# Patient Record
Sex: Male | Born: 1960 | Race: Black or African American | Hispanic: No | Marital: Married | Smoking: Never smoker
Health system: Southern US, Community
[De-identification: ages and names within clinical notes are randomized; demographics above are authoritative.]

## PROBLEM LIST (undated history)

## (undated) DIAGNOSIS — I10 Essential (primary) hypertension: Secondary | ICD-10-CM

## (undated) DIAGNOSIS — I8390 Asymptomatic varicose veins of unspecified lower extremity: Secondary | ICD-10-CM

## (undated) DIAGNOSIS — E079 Disorder of thyroid, unspecified: Secondary | ICD-10-CM

## (undated) HISTORY — PX: NECK SURGERY: SHX720

## (undated) HISTORY — PX: VARICOSE VEIN SURGERY: SHX832

---

## 2000-07-24 ENCOUNTER — Emergency Department (HOSPITAL_COMMUNITY): Admission: EM | Admit: 2000-07-24 | Discharge: 2000-07-24 | Payer: Self-pay | Admitting: Emergency Medicine

## 2004-07-16 ENCOUNTER — Emergency Department (HOSPITAL_COMMUNITY): Admission: EM | Admit: 2004-07-16 | Discharge: 2004-07-17 | Payer: Self-pay | Admitting: Emergency Medicine

## 2006-02-16 ENCOUNTER — Emergency Department (HOSPITAL_COMMUNITY): Admission: EM | Admit: 2006-02-16 | Discharge: 2006-02-16 | Payer: Self-pay | Admitting: Emergency Medicine

## 2006-05-31 ENCOUNTER — Other Ambulatory Visit: Admission: RE | Admit: 2006-05-31 | Discharge: 2006-05-31 | Payer: Self-pay | Admitting: Otolaryngology

## 2007-06-19 ENCOUNTER — Ambulatory Visit (HOSPITAL_BASED_OUTPATIENT_CLINIC_OR_DEPARTMENT_OTHER): Admission: RE | Admit: 2007-06-19 | Discharge: 2007-06-19 | Payer: Self-pay | Admitting: Internal Medicine

## 2007-06-22 ENCOUNTER — Ambulatory Visit: Payer: Self-pay | Admitting: Internal Medicine

## 2008-03-22 ENCOUNTER — Encounter: Admission: RE | Admit: 2008-03-22 | Discharge: 2008-03-22 | Payer: Self-pay | Admitting: Otolaryngology

## 2008-05-19 ENCOUNTER — Ambulatory Visit (HOSPITAL_COMMUNITY): Admission: RE | Admit: 2008-05-19 | Discharge: 2008-05-20 | Payer: Self-pay | Admitting: Otolaryngology

## 2008-05-19 ENCOUNTER — Encounter (INDEPENDENT_AMBULATORY_CARE_PROVIDER_SITE_OTHER): Payer: Self-pay | Admitting: Otolaryngology

## 2010-01-19 ENCOUNTER — Emergency Department (HOSPITAL_COMMUNITY): Admission: EM | Admit: 2010-01-19 | Discharge: 2010-01-19 | Payer: Self-pay | Admitting: Emergency Medicine

## 2010-01-19 ENCOUNTER — Encounter (INDEPENDENT_AMBULATORY_CARE_PROVIDER_SITE_OTHER): Payer: Self-pay | Admitting: Emergency Medicine

## 2010-01-19 ENCOUNTER — Ambulatory Visit: Payer: Self-pay | Admitting: Vascular Surgery

## 2011-02-14 LAB — POCT I-STAT, CHEM 8
BUN: 16 mg/dL (ref 6–23)
Chloride: 105 mEq/L (ref 96–112)
HCT: 47 % (ref 39.0–52.0)
Potassium: 3.7 mEq/L (ref 3.5–5.1)
Sodium: 139 mEq/L (ref 135–145)

## 2011-04-10 NOTE — Procedures (Signed)
Riley Coleman, Riley Coleman              ACCOUNT NO.:  [id number]   MEDICAL RECORD NO.:  [id number]          PATIENT TYPE:  OUT   LOCATION:  SLEEP CENTER                 FACILITY:  [facility]   PHYSICIAN:  Clinton D. Maple Hudson, MD, FCCP, FACPDATE OF BIRTH:  04-02-63   DATE OF STUDY:  06/19/2007                            NOCTURNAL POLYSOMNOGRAM   REFERRING PHYSICIAN:   REFERRING PHYSICIAN:  Gabriel Earing, M.D.   INDICATION FOR STUDY:  Hypersomnia with sleep apnea.   EPWORTH SLEEPINESS SCORE:  24/24.  BMI 40.5, weight 300 pounds.   MEDICATIONS:  Home medications listed and reviewed.   SLEEP ARCHITECTURE:  Total sleep time 379 minute with sleep efficiency  88%.  Stage I was 2%.  Stage II 55%.  Stage III 12%.  REM 30% of total  sleep time.  Sleep latency 9 minutes.  REM latency 113 minutes.  Awake  after sleep onset 41 minutes.  Arousal index 24.9 indicating increased  sleep fragmentation.  He slept only on his back.  No bedtime medication  was taken.   RESPIRATORY DATA:  Diagnostic NPSG protocol was ordered with  authorization to do split study in case of significant desaturation.  Split protocol was met.  Apnea hypopnea index (AHI, RDI) 94.2  obstructive events per hour indicating severe obstructive sleep  apnea/hypopnea syndrome before CPAP.  There were 242 obstructive apneas,  10 hypopneas before CPAP.  He slept only on his back and all events were  recorded in that position.  REM AHI 94.7.  CPAP was titrated to 25 CWP.  More breakthrough events were recorded at higher pressures suggesting he  was developing nasal congestion.  Best recorded pressure was at 22 chest  wall pain, AHI 3 per hour.  A large Comfort Full Gel Mask was chosen  with heated humidifier.   OXYGEN DATA:  Very loud snoring with oxygen desaturation to a nadir of  62% before CPAP.  Best oxygenation was associated with CPAP at 22 CWP  with continued scores between 88 and 92% on room air.   CARDIAC DATA:  Sinus rhythm  with PAC.   MOVEMENT-PARASOMNIA:  No significant body movement or limb disturbance.   IMPRESSIONS-RECOMMENDATIONS:  1. Severe obstructive sleep apnea/hypopnea syndrome, apnea hypopnea      index 94.2 per hour with all sleep and all events recorded while on      his back.  Very loud snoring with oxygen desaturation to a nadir of      62%.  2. CPAP was titrated to a recommended pressure of 22 CWP, apnea      hypopnea index 3 per hour.  A large Comfort Full Gel Mask was used      with heated humidifier.  Oxygen desaturation still seemed to be      holding between 88 and 92% at this setting.  Consider establishing      home CPAP or BiPAP at an initial inspiratory      pressure of 22 CWP.  Then consider overnight oxymetry while he      wears CPAP to determine whether he needs supplemental home oxygen      during sleep.  Clinton D. Maple Hudson, MD, [facility], FACP  Diplomate, Biomedical engineer of Sleep Medicine  Electronically Signed     CDY/MEDQ  D:  06/22/2007 12:09:14  T:  06/23/2007 09:02:08  Job:  [id number]

## 2011-04-10 NOTE — Op Note (Signed)
NAMEBEAUREGARD, JARRELLS              ACCOUNT NO.:  [id number]   MEDICAL RECORD NO.:  [id number]          PATIENT TYPE:  OIB   LOCATION:  2621                         FACILITY:  [facility]   PHYSICIAN:  Kinnie Scales. Annalee Genta, M.D.DATE OF BIRTH:  [birth year]   DATE OF PROCEDURE:  DATE OF DISCHARGE:                               OPERATIVE REPORT   LOCATION:  [facility] Main OR.   PREOPERATIVE DIAGNOSES:  1. Massive right thyroid tumor.  2. History of obstructive sleep apnea.   POSTOPERATIVE DIAGNOSES:  1. Massive right thyroid tumor.  2. History of obstructive sleep apnea.   INDICATIONS FOR SURGERY:  1. Massive right thyroid tumor.  2. History of obstructive sleep apnea.   SURGICAL PROCEDURES:  Right thyroid lobectomy with mediastinal  exploration.   ANESTHESIA:  General endotracheal with NIMS endotracheal tube.   SURGEON:  Kinnie Scales. Annalee Genta, MD   ASSISTANT:  Antony Contras, MD   SPECIMENS:  Sent to pathology.   ESTIMATED BLOOD LOSS:  Less than 100 mL.   No complications.   The patient was transferred from the operating room to recovery in  stable condition.   BRIEF HISTORY:  Mr. Pinheiro is a 50 year old black male who was referred  for evaluation by his primary care physician for evaluation of an  enlarging right thyroid mass.  The patient was initially seen over 1  year ago.  Fine-needle aspiration of the mass was performed, and  pathology was suspicious for possible thyroid neoplasm.  At that time, I  recommended thyroid lobectomy.  The patient deferred surgery and  presented over 1 year later with an increase in size of the mass.  Prior  to surgery, an MRI scan was obtained to better delineate the anatomy.  The patient was found to have a 6 x 8 cm right thyroid lobe with  significant tracheal deviation and extension to the mediastinum and  right upper chest.  Given the patient's history, physical examination,  and findings, I recommended to undertake a right  hemithyroidectomy with  expiration of the mediastinum and possible total thyroidectomy based on  frozen section analysis.  The risk, benefits, and possible complications  of this procedure were discussed in detail.  The patient understood and  concurred with our plan for surgery.  He also had a significant history  of severe obstructive sleep apnea, and we anticipated postoperative  monitoring in the Step-Down Unit.  The patient understood and concurred  with our plan which was scheduled at [facility] Main OR on May 19, 2008.   PROCEDURE:  The patient was brought to the operating room at [facility] Main OR, placed in supine position on the operating table.  General endotracheal anesthesia was established.  A Xomed NIMS  endotracheal tube was used Systems analyst System) for  intraoperative monitoring of the recurrent laryngeal nerves.  The  patient was then position on the operating table and prepped and draped  in a sterile fashion.  He was injected with 4 mL of 1% lidocaine with  1:100,000 solution of epinephrine in a subcutaneous fashion to  the  proposed skin incision.  The patient's surgery then begun by creating a  curvilinear low anterior neck incision measuring approximately 8 cm in  length.  This was carried through the skin and underlying deep  subcutaneous tissue laterally.  The platysma muscle was identified and  divided, and subplatysmal flaps were elevated in order to allow access  to the anterior compartment of the neck.  A self-retaining retractor was  placed.  The midline neck was gently palpated.  There was significant  tracheal and laryngeal deviation because of the size of the right  thyroid mass with rotation and deviation to the left.  The strap muscles  were identified and divided in the midline.  They were then lateralized  allowing access to the anterior compartment.  The right thyroid lobe was  palpated, very large in nature and  somewhat nodular.  The procedure  begun by dissecting and then dividing the thyroid isthmus to allow  observation of the anterior trachea.  This was accomplished using  clamps, and a 2-0 chromic suture to ligate the left aspect of thyroid  isthmus.  The left thyroid lobe was then gently palpated, and there was  no evidence of nodularity, mass, or tumor and it appeared to be normal  in size and configuration.  Dissection was then carried out elevating  the strap muscles and sternocleidomastoid muscle laterally in order to  allow access to the right aspect of the neck.  Dissection was carried  out along the superior aspect of the thyroid lobe dissecting along the  right perilaryngeal aspect of the neck.  The superior pole of the  thyroid was then gently dissected with traction to bring the thyroid  vessels into view.  These were divided and suture ligated with 2-0 silk  suture.  The thyroid lobe was then elevated anteriorly and dissection  was carried out along the lateral and posterior aspect of the gland  dividing the middle thyroid vein and small contributory blood vessels.  The recurrent laryngeal nerve was then identified in the deep aspect of  the dissection.  This was confirmed with the nerve monitoring system,  and the right recurrent laryngeal nerve was then dissected from proximal  to distal to its insertion on the laryngeal musculature.  With the nerve  identified and preserved, the remainder of the thyroid lobe was elevated  off the trachea, and using both blunt and sharp dissection it was  removed from its tracheal attachments.  The inferior aspect of the gland  was then palpated in the deep aspect of the superior mediastinum and  neck.  Overlying fascia was then gently elevated.  The gland was  retracted superiorly.  The inferior thyroid vasculature was then gently  dissected and divided and suture ligated.  Contributory vessels from the  thyroid vein and mediastinum were then  gently dissected, divided, and  suture ligated.  The apex of the lung was visualized in the right  superior mediastinum.  The pleura was intact and there was no evidence  of a pneumothorax or air leak.  The gland was then fully mobilized and  resected from its deep attachments.  There was no significant bleeding.  The gland was then sent to pathology for frozen section analysis.  Pathology showed a follicular neoplasm.  Malignant tumor could not be  confirmed, and the procedure was then terminated with a right  hemithyroidectomy rather than completing the left side.  The patient's  dissection was then thoroughly irrigated with saline.  There were small  areas of point hemorrhages that were cauterized with bipolar cautery.  The patient's wound was then closed in multiple layers.  Prior to  closure, a 7-mm Blake drain was placed at the depth of the neck incision  and carried out through a separate stab incision in the anterior neck  skin.  Strap muscles were reapproximated with a 3-0 Vicryl suture.  Deep  muscular layer was closed 3-0 Vicryl as was as the platysma layer.  Subcutaneous closure with a 4-0 Vicryl suture in an interrupted fashion  was then pleated and the final skin closure with Dermabond  surgical glue was then applied.  The patient was then awakened from his  anesthetic.  He was extubated and transferred from the operating room to  the recovery in stable condition.  No complications.  Estimated blood  loss was less 100 mL.           ______________________________  Kinnie Scales. Annalee Genta, M.D.     DLS/MEDQ  D:  45/40/9811  T:  05/20/2008  Job:  [id number]

## 2011-08-23 LAB — CBC
Hemoglobin: 16.1
MCHC: 33.8
MCV: 86.1
RBC: 5.52
WBC: 5.1

## 2011-08-23 LAB — BASIC METABOLIC PANEL
CO2: 28
Chloride: 103
Creatinine, Ser: 1.22
GFR calc Af Amer: >60
Potassium: 4.2
Sodium: 137

## 2012-04-15 ENCOUNTER — Emergency Department (HOSPITAL_COMMUNITY): Payer: BC Managed Care – PPO

## 2012-04-15 ENCOUNTER — Observation Stay (HOSPITAL_COMMUNITY)
Admission: EM | Admit: 2012-04-15 | Discharge: 2012-04-17 | Disposition: A | Discharge status: Home / Self Care | Payer: BC Managed Care – PPO | Attending: Internal Medicine | Admitting: Internal Medicine

## 2012-04-15 ENCOUNTER — Encounter (HOSPITAL_COMMUNITY): Payer: Self-pay

## 2012-04-15 DIAGNOSIS — Z79899 Other long term (current) drug therapy: Secondary | ICD-10-CM | POA: Insufficient documentation

## 2012-04-15 DIAGNOSIS — G4733 Obstructive sleep apnea (adult) (pediatric): Secondary | ICD-10-CM | POA: Insufficient documentation

## 2012-04-15 DIAGNOSIS — I959 Hypotension, unspecified: Secondary | ICD-10-CM | POA: Diagnosis present

## 2012-04-15 DIAGNOSIS — R55 Syncope and collapse: Principal | ICD-10-CM

## 2012-04-15 DIAGNOSIS — M6282 Rhabdomyolysis: Secondary | ICD-10-CM | POA: Insufficient documentation

## 2012-04-15 DIAGNOSIS — G4730 Sleep apnea, unspecified: Secondary | ICD-10-CM

## 2012-04-15 DIAGNOSIS — N179 Acute kidney failure, unspecified: Secondary | ICD-10-CM

## 2012-04-15 DIAGNOSIS — I10 Essential (primary) hypertension: Secondary | ICD-10-CM

## 2012-04-15 HISTORY — DX: Essential (primary) hypertension: I10

## 2012-04-15 LAB — CBC
HCT: 52.9 % — ABNORMAL HIGH (ref 39.0–52.0)
MCH: 25.2 pg — ABNORMAL LOW (ref 26.0–34.0)
MCV: 78.5 fL (ref 78.0–100.0)
Platelets: 162 10*3/uL (ref 150–400)
RBC: 6.74 MIL/uL — ABNORMAL HIGH (ref 4.22–5.81)
RDW: 16.7 % — ABNORMAL HIGH (ref 11.5–15.5)
WBC: 8.3 10*3/uL (ref 4.0–10.5)

## 2012-04-15 LAB — BASIC METABOLIC PANEL
CO2: 28 mEq/L (ref 19–32)
Calcium: 9.3 mg/dL (ref 8.4–10.5)
Creatinine, Ser: 2.22 mg/dL — ABNORMAL HIGH (ref 0.50–1.35)
GFR calc non Af Amer: 33 mL/min — ABNORMAL LOW (ref >90)
Glucose, Bld: 72 mg/dL (ref 70–99)

## 2012-04-15 LAB — DIFFERENTIAL
Eosinophils Absolute: 0.2 10*3/uL (ref 0.0–0.7)
Eosinophils Relative: 2 % (ref 0–5)
Lymphocytes Relative: 19 % (ref 12–46)
Lymphs Abs: 1.6 10*3/uL (ref 0.7–4.0)
Monocytes Absolute: 0.9 10*3/uL (ref 0.1–1.0)

## 2012-04-15 MED ORDER — SODIUM CHLORIDE 0.9 % IV BOLUS (SEPSIS)
500.0000 mL | Freq: Once | INTRAVENOUS | Status: AC
  Administered 2012-04-15: 500 mL via INTRAVENOUS

## 2012-04-15 MED ORDER — SODIUM CHLORIDE 0.9 % IV SOLN
INTRAVENOUS | Status: DC
  Administered 2012-04-15 – 2012-04-16 (×3): via INTRAVENOUS

## 2012-04-15 NOTE — ED Notes (Signed)
Pt had two sycopal episodes today, [place] [address], no injury from this episode, he saw a cardiologist yesterday and was given a good report, he states today he was coughing on the highway and then woke up to a lady knocking on his window.

## 2012-04-15 NOTE — ED Provider Notes (Signed)
History     CSN: [id number]  Arrival date & time 04/15/12  7829   First MD Initiated Contact with Patient 04/15/12 2153      Chief Complaint  Patient presents with  . Loss of Consciousness    (Consider location/radiation/quality/duration/timing/severity/associated sxs/prior treatment) Patient is a 51 y.o. male presenting with syncope. The history is provided by the patient.  Loss of Consciousness This is a recurrent problem. Pertinent negatives include no chest pain, no abdominal pain, no headaches and no shortness of breath.   patient had a syncopal episode today. He states that he was in the car and began to cough. He states he felt like his to pass out and then later woke up in the middle the highway to someone knocking on his window. No chest pain. He's had previous episodes of syncope. One was when he was straining to go to the bathroom. He states he saw Dr. Jacinto Halim, his cardiologist, yesterday he changed his metoprolol dose. He states that he has an enlarged heart. No headaches. No confusion. No trouble breathing. No family history of early death of unknown cause.  Past Medical History  Diagnosis Date  . Hypertension     History reviewed. No pertinent past surgical history.  History reviewed. No pertinent family history.  History  Substance Use Topics  . Smoking status: Not on file  . Smokeless tobacco: Not on file  . Alcohol Use: No      Review of Systems  Constitutional: Negative for activity change and appetite change.  HENT: Negative for neck stiffness.   Eyes: Negative for pain.  Respiratory: Negative for chest tightness and shortness of breath.   Cardiovascular: Positive for syncope. Negative for chest pain and leg swelling.  Gastrointestinal: Negative for nausea, vomiting, abdominal pain and diarrhea.  Genitourinary: Negative for flank pain.  Musculoskeletal: Negative for back pain.  Skin: Negative for rash.  Neurological: Positive for dizziness and syncope.  Negative for weakness, numbness and headaches.  Psychiatric/Behavioral: Negative for behavioral problems.    Allergies  Review of patient's allergies indicates no known allergies.  Home Medications   Current Outpatient Rx  Name Route Sig Dispense Refill  . METOPROLOL TARTRATE 100 MG PO TABS Oral Take 50 mg by mouth 2 (two) times daily.    Marland Kitchen OLMESARTAN-AMLODIPINE-HCTZ 40-10-25 MG PO TABS Oral Take 1 tablet by mouth every morning.    Marland Kitchen VITAMIN D (ERGOCALCIFEROL) 50000 UNITS PO CAPS Oral Take 50,000 Units by mouth See admin instructions. Take on sundays      BP 101/55  Pulse 83  Temp(Src) 98 F (36.7 C) (Oral)  Resp 20  Ht 6' (1.829 m)  Wt 280 lb (127.007 kg)  BMI 37.97 kg/m2  SpO2 93%  Physical Exam  Nursing note and vitals reviewed. Constitutional: He is oriented to person, place, and time. He appears well-developed and well-nourished.  HENT:  Head: Normocephalic and atraumatic.  Eyes: Pupils are equal, round, and reactive to light.  Neck: Normal range of motion. Neck supple.  Cardiovascular: Normal rate, regular rhythm and normal heart sounds.   No murmur heard. Pulmonary/Chest: Effort normal and breath sounds normal.  Abdominal: Soft. Bowel sounds are normal. He exhibits no distension and no mass. There is no tenderness. There is no rebound and no guarding.  Musculoskeletal: Normal range of motion. He exhibits no edema.  Neurological: He is alert and oriented to person, place, and time. No cranial nerve deficit.  Skin: Skin is warm and dry.  Psychiatric: He has a  normal mood and affect.    ED Course  Procedures (including critical care time)  Labs Reviewed  CBC - Abnormal; Notable for the following:    RBC 6.74 (*)    HCT 52.9 (*)    MCH 25.2 (*)    RDW 16.7 (*)    All other components within normal limits  BASIC METABOLIC PANEL - Abnormal; Notable for the following:    BUN 24 (*)    Creatinine, Ser 2.22 (*)    GFR calc non Af Amer 33 (*)    GFR calc Af Amer  39 (*)    All other components within normal limits  D-DIMER, QUANTITATIVE - Abnormal; Notable for the following:    D-Dimer, Quant 0.74 (*)    All other components within normal limits  DIFFERENTIAL  TROPONIN I   Dg Chest 2 View  04/15/2012  *RADIOLOGY REPORT*  Clinical Data: Syncope, fever, cough.  CHEST - 2 VIEW  Comparison: 05/18/2008  Findings: Cardiomegaly.  Central vascular congestion.  Interstitial prominence.  No focal consolidation, pleural effusion, or pneumothorax.  No definite nodule of small nodules could be obscured by the interstitial prominence.  No acute osseous finding.  IMPRESSION: Cardiomegaly with central vascular congestion.  Mild nonspecific interstitial prominence without focal consolidation.  Original Report Authenticated By: Waneta Martins, M.D.     1. Syncope      Date: 04/15/2012  Rate: 82  Rhythm: normal sinus rhythm  QRS Axis: normal  Intervals: normal  ST/T Wave abnormalities: nonspecific ST/T changes  Conduction Disutrbances:none  Narrative Interpretation: Q wave inversion inferiorly. Q wave in lead III, S wave in lead I  Old EKG Reviewed: changes noted    MDM  Patient was syncope. He was with coughing, but while he was driving. No chest pain. He does have a pulse ox of 73%. D-dimer was done and is positive. He also has renal insufficiency. His blood pressures improved with some fluids. His been told in the past he has a large heart. He'll be admitted to medicine. His EKG has changed as compared to the old from 2011.       Juliet Rude. Rubin Payor, MD 04/16/12 [phone]

## 2012-04-16 ENCOUNTER — Observation Stay (HOSPITAL_COMMUNITY): Payer: BC Managed Care – PPO

## 2012-04-16 ENCOUNTER — Encounter (HOSPITAL_COMMUNITY): Payer: Self-pay | Admitting: Family Medicine

## 2012-04-16 DIAGNOSIS — G4710 Hypersomnia, unspecified: Secondary | ICD-10-CM

## 2012-04-16 DIAGNOSIS — I10 Essential (primary) hypertension: Secondary | ICD-10-CM

## 2012-04-16 DIAGNOSIS — N179 Acute kidney failure, unspecified: Secondary | ICD-10-CM

## 2012-04-16 DIAGNOSIS — G4730 Sleep apnea, unspecified: Secondary | ICD-10-CM

## 2012-04-16 DIAGNOSIS — R55 Syncope and collapse: Secondary | ICD-10-CM

## 2012-04-16 DIAGNOSIS — I959 Hypotension, unspecified: Secondary | ICD-10-CM | POA: Diagnosis present

## 2012-04-16 LAB — CBC
MCH: 24.9 pg — ABNORMAL LOW (ref 26.0–34.0)
MCHC: 32.1 g/dL (ref 30.0–36.0)
MCV: 77.8 fL — ABNORMAL LOW (ref 78.0–100.0)
Platelets: 156 10*3/uL (ref 150–400)
RDW: 16.7 % — ABNORMAL HIGH (ref 11.5–15.5)

## 2012-04-16 LAB — COMPREHENSIVE METABOLIC PANEL
ALT: 29 U/L (ref 0–53)
AST: 29 U/L (ref 0–37)
Albumin: 3.6 g/dL (ref 3.5–5.2)
Alkaline Phosphatase: 51 U/L (ref 39–117)
CO2: 29 mEq/L (ref 19–32)
Chloride: 98 mEq/L (ref 96–112)
Potassium: 3.8 mEq/L (ref 3.5–5.1)
Total Bilirubin: 0.9 mg/dL (ref 0.3–1.2)

## 2012-04-16 LAB — CARDIAC PANEL(CRET KIN+CKTOT+MB+TROPI)
Relative Index: 1 (ref 0.0–2.5)
Relative Index: 1.4 (ref 0.0–2.5)
Total CK: 851 U/L — ABNORMAL HIGH (ref 7–232)
Total CK: 678 U/L — ABNORMAL HIGH (ref 7–232)
Troponin I: <0.3 ng/mL (ref <0.30)
Troponin I: <0.3 ng/mL (ref <0.30)
Troponin I: <0.3 ng/mL (ref <0.30)

## 2012-04-16 MED ORDER — VITAMIN D (ERGOCALCIFEROL) 1.25 MG (50000 UNIT) PO CAPS: 50000.0000 [IU] | ORAL_CAPSULE | ORAL | Status: DC

## 2012-04-16 MED ORDER — HEPARIN SODIUM (PORCINE) 5000 UNIT/ML IJ SOLN
5000.0000 [IU] | Freq: Three times a day (TID) | INTRAMUSCULAR | Status: DC
  Administered 2012-04-16 – 2012-04-17 (×4): 5000 [IU] via SUBCUTANEOUS
  Filled 2012-04-16 (×7): qty 1

## 2012-04-16 MED ORDER — TECHNETIUM TO 99M ALBUMIN AGGREGATED
5.0000 | Freq: Once | INTRAVENOUS | Status: AC | PRN
  Administered 2012-04-16: 5 via INTRAVENOUS

## 2012-04-16 MED ORDER — ENOXAPARIN SODIUM 150 MG/ML ~~LOC~~ SOLN
1.0000 mg/kg | Freq: Once | SUBCUTANEOUS | Status: AC
  Administered 2012-04-16: 130 mg via SUBCUTANEOUS
  Filled 2012-04-16: qty 1

## 2012-04-16 MED ORDER — METOPROLOL TARTRATE 25 MG PO TABS
25.0000 mg | ORAL_TABLET | Freq: Two times a day (BID) | ORAL | Status: DC
  Administered 2012-04-16 – 2012-04-17 (×3): 25 mg via ORAL
  Filled 2012-04-16 (×5): qty 1

## 2012-04-16 MED ORDER — SODIUM CHLORIDE 0.9 % IJ SOLN
3.0000 mL | Freq: Two times a day (BID) | INTRAMUSCULAR | Status: DC
  Administered 2012-04-16: 3 mL via INTRAVENOUS

## 2012-04-16 MED ORDER — ASPIRIN EC 325 MG PO TBEC
325.0000 mg | DELAYED_RELEASE_TABLET | Freq: Every day | ORAL | Status: DC
  Administered 2012-04-16 – 2012-04-17 (×2): 325 mg via ORAL
  Filled 2012-04-16 (×2): qty 1

## 2012-04-16 MED ORDER — AMLODIPINE BESYLATE 2.5 MG PO TABS
2.5000 mg | ORAL_TABLET | Freq: Every day | ORAL | Status: DC
  Administered 2012-04-16 – 2012-04-17 (×2): 2.5 mg via ORAL
  Filled 2012-04-16 (×2): qty 1

## 2012-04-16 NOTE — Progress Notes (Signed)
Pt's CK MB elevated at 8.3 and CK Total 851. 1st and 2nd set of Troponins negative. NP on call, K. Schorr notified.  No new orders at this time.  Will continue to monitor pt.

## 2012-04-16 NOTE — Progress Notes (Signed)
Subjective: Feels fine today.  Objective: Weight change:   Intake/Output Summary (Last 24 hours) at 04/16/12 1514 Last data filed at 04/16/12 1500  Gross per 24 hour  Intake 1410.42 ml  Output   1075 ml  Net 335.42 ml    Filed Vitals:   04/16/12 1020  BP: 127/78  Pulse:   Temp:   Resp:   General: Patient does not seem to be in any acute distress.  Cardiovascular: Regular rate rhythm Lungs: Clear to auscultation bilaterally Abdomen: Positive bowel sounds Extremities: Edema mostly in the right lower extremity that is chronic  Lab Results: reviewed  Studies/Results: Dg Chest 2 View  04/15/2012  *RADIOLOGY REPORT*  Clinical Data: Syncope, fever, cough.  CHEST - 2 VIEW  Comparison: 05/18/2008  Findings: Cardiomegaly.  Central vascular congestion.  Interstitial prominence.  No focal consolidation, pleural effusion, or pneumothorax.  No definite nodule of small nodules could be obscured by the interstitial prominence.  No acute osseous finding.  IMPRESSION: Cardiomegaly with central vascular congestion.  Mild nonspecific interstitial prominence without focal consolidation.  Original Report Authenticated By: Waneta Martins, M.D.   Ct Head Wo Contrast  04/16/2012  *RADIOLOGY REPORT*  Clinical Data: Near syncope.  Hypertension  CT HEAD WITHOUT CONTRAST  Technique:  Contiguous axial images were obtained from the base of the skull through the vertex without contrast.  Comparison: None.  Findings: Ventricle size is normal.  Negative for infarct, mass, or hemorrhage.  No edema or shift of the midline structures. Calvarium is intact.  IMPRESSION: Normal examination.  Original Report Authenticated By: Camelia Phenes, M.D.   Nm Pulmonary Perfusion  04/16/2012  *RADIOLOGY REPORT*  Clinical Data: Elevated D-dimer.  Syncope.  NM PULMONARY PERFUSION PARTICULATE  Radiopharmaceutical: CURIE MAA TECHNETIUM TO 68M ALBUMIN AGGREGATED  Comparison: Chest x-ray from 04/15/2012.  Findings:  Ventilation imaging was not performed secondary to lack of Xe 133 availability.  Multiplanar perfusion imaging shows no segmental perfusion defect in either lung.  IMPRESSION: Normal bilateral lung perfusion.  Original Report Authenticated By: ERIC A. MANSELL, M.D.   Medications: Scheduled Meds:   . amLODipine  2.5 mg Oral Daily  . aspirin EC  325 mg Oral Daily  . enoxaparin (LOVENOX) injection  1 mg/kg Subcutaneous Once  . heparin  5,000 Units Subcutaneous Q8H  . metoprolol tartrate  25 mg Oral BID  . sodium chloride  500 mL Intravenous Once  . sodium chloride  3 mL Intravenous Q12H  . Vitamin D (Ergocalciferol)  50,000 Units Oral Q Sun   Continuous Infusions:   . sodium chloride 50 mL/hr at 04/16/12 1200   PRN Meds:.technetium albumin aggregated  Assessment/Plan: Syncope (04/16/2012) Discussed with Dr. Jacinto Halim. This is most likely secondary to hypotension since he was just recently started on Metoprolol and when he came in his blood pressure was in the 90s systolic. Metoprolol dose has been  decreased and his Lasix has been discontinued. He will followup outpatient tomorrow with Dr. Verl Dicker office at 4:30 PM for an event monitor that he will wear for a month. He will also follow up with Dr. Jacinto Halim for office visit on Thursday June 6 at 3:30 PM. Will also order an EEG, carotid Dopplers, head CT and V/Q scan.   Sleep apnea (04/16/2012) Outpatient followup  HTN (hypertension) (04/16/2012) Blood pressure mildly elevated. Decrease IV fluids.   AKI (acute kidney injury) (04/16/2012) Patient's creatinine is elevated from baseline. Will continue to monitor.   Hypotension (04/16/2012)  as mentioned above hypertension is secondary  to medication and medications have been adjusted.    LOS: 1 day   Cristan Scherzer JARRETT 04/16/2012, 3:14 PM Pager [phone]

## 2012-04-16 NOTE — Progress Notes (Signed)
Pt's BP elevated when standing BP taken during Orthostatic Vitals (163/109).  BP elevated again when a.m. Vitals taken at [phone].  BP  155/108.  NP on call notified, K. Black.  Will continue to monitor pt.

## 2012-04-16 NOTE — Progress Notes (Signed)
   CARE MANAGEMENT NOTE 04/16/2012  Patient:  Riley Coleman, Riley Coleman   Account Number:  [id number]  Date Initiated:  04/16/2012  Documentation initiated by:  Jiles Crocker  Subjective/Objective Assessment:   ADMITTED WITH SYNCOPAL EPISODE     Action/Plan:   INDEPENDENT PRIOR TO ADMISSION; LIVES WITH SPOUSE   Anticipated DC Date:  04/17/2012   Anticipated DC Plan:  HOME/SELF CARE           Status of service:  In process, will continue to follow Medicare Important Message given?  NA - LOS <3 / Initial given by admissions (If response is "NO", the following Medicare IM given date fields will be blank)  Per UR Regulation:  Reviewed for med. necessity/level of care/duration of stay  Comments:  04/16/2012- B Ezrael Sam RN, BSN, MHA

## 2012-04-16 NOTE — Progress Notes (Addendum)
Pt having obvious sleep apnea during day shift.  Very sleepy, sleeping almost all the time but wakes easily.  Snoring loudly with periods of 3-10 seconds of apnea between snores/breaths.  When awakened pt stated that he does suffer from sleep apnea and that he is supposed to wear a cpap while sleeping.  Text-paged Dr. Earlene Plater to notify, requested order for cpap.  Pt's vital signs stable, will continue to monitor while awaiting orders.  Ardyth Gal, RN 04/16/2012  Order received for cpap.  Ardyth Gal, RN 04/16/2012

## 2012-04-16 NOTE — Progress Notes (Signed)
VASCULAR LAB PRELIMINARY  PRELIMINARY  PRELIMINARY  PRELIMINARY  Carotid duplex completed.    Preliminary report:  Bilateral:  No evidence of hemodynamically significant internal carotid artery stenosis.   Vertebral artery flow is antegrade.     NATHAN, MICHELLE, RDMS, RDCS 04/16/2012, 1:21 PM  Quashaun Lazalde D, RVS 04/16/2012, 1:21 PM

## 2012-04-16 NOTE — H&P (Signed)
History and Physical  Riley Coleman ZOX:[id number] DOB: 1961/05/01 DOA: 04/15/2012  Referring physician: PCP: No primary provider on file.   Chief Complaint: syncope  HPI:  51 yr old male was driving home from work today and staretd to cough uncontrollably while he was driving and was getting ready pass out while [place] [address] and he started to have some shaking of his hands an dfelt like he was about to pass out--Felt like a lightheadedness coming on and "felt the blood rushing to his head" NO h/o seizures, no strokwe in the past-- came [place] to a woman knocking on his winow in the middle of [place].  This ha shappeend before-has happened some times before-Before with a  Burst varcisoe vein, another time he had tried to carry his wife when she had hurt her ACL.  Another rtime this happened when he wa son the commode strainign and passe out onto the bathroom floor.   Saw Dr. Jacinto Halim which was done at Dr. Payton Spark office-stress test done was okay per Wife's report NO cp, n/v/sob/blurred vision+ occasional, no double vision, no cough cold or fever, no diarrhoea, no dysuria Has been passing less urine than usual and is "dark"  States that his medications have changed recently to metoprolol bid  Wasn't confused when he came to   Uses CPPA usually-once in a while only and is non-compliant.  Has had  Asleep study in the past which was + for desats 911 tiems  Chart Review:  Review of Systems:  Neg except as per above  Past Medical History  Diagnosis Date  . Hypertension     History reviewed. No pertinent past surgical history.  Social History:  does not have a smoking history on file. He does not have any smokeless tobacco history on file. He reports that he does not drink alcohol. His drug history not on file.  No Known Allergies  History reviewed. No pertinent family history.   Prior to Admission medications   Medication Sig Start Date End Date Taking?  Authorizing Provider  metoprolol (LOPRESSOR) 100 MG tablet Take 50 mg by mouth 2 (two) times daily.   Yes Historical Provider, MD  Olmesartan-Amlodipine-HCTZ (TRIBENZOR) 40-10-25 MG TABS Take 1 tablet by mouth every morning.   Yes Historical Provider, MD  Vitamin D, Ergocalciferol, (DRISDOL) 50000 UNITS CAPS Take 50,000 Units by mouth See admin instructions. Take on sundays   Yes Historical Provider, MD   Physical Exam: Filed Vitals:   04/15/12 1949 04/15/12 2229 04/16/12 0025  BP: 91/62 101/55   Pulse: 92 83   Temp: 97.2 F (36.2 C) 98 F (36.7 C)   TempSrc: Oral Oral   Resp: 20 20   Height:   6' (1.829 m)  Weight:   127.007 kg (280 lb)  SpO2: 93% 93%      General:  Morbidly obese African American male, no apparent distress  Eyes: Pallor no icterus  ENT: Mallampati 4  Neck: Soft supple no JVD  Cardiovascular: S1-S2 no murmur rub or gallop  Respiratory: Clinically clear  Abdomen: Soft nontender nondistended  Skin:  edema slight on right side  Musculoskeletal: Good range of motion bilaterally  Psychiatric: Pleasant  Neurologic: Cranial nerves II through XII grossly intact  Labs on Admission:  Basic Metabolic Panel:  Lab 04/15/12 8119  NA 136  K 3.6  CL 99  CO2 28  GLUCOSE 72  BUN 24*  CREATININE 2.22*  CALCIUM 9.3  MG --  PHOS --    Liver Function Tests: No results found for this basename: AST:5,ALT:5,ALKPHOS:5,BILITOT:5,PROT:5,ALBUMIN:5 in the last 168 hours No results found for this basename: LIPASE:5,AMYLASE:5 in the last 168 hours No results found for this basename: AMMONIA:5 in the last 168 hours  CBC:  Lab 04/15/12 2206  WBC 8.3  NEUTROABS 5.6  HGB 17.0  HCT 52.9*  MCV 78.5  PLT 162    Cardiac Enzymes:  Lab 04/15/12 2206  CKTOTAL --  CKMB --  CKMBINDEX --  TROPONINI <0.30    Troponin (Point of Care Test) No results found for this basename: TROPIPOC in the last 72 hours  BNP (last 3 results) No results found for this  basename: PROBNP:3 in the last 8760 hours  CBG: No results found for this basename: GLUCAP:5 in the last 168 hours   Radiological Exams on Admission: Dg Chest 2 View  04/15/2012  *RADIOLOGY REPORT*  Clinical Data: Syncope, fever, cough.  CHEST - 2 VIEW  Comparison: 05/18/2008  Findings: Cardiomegaly.  Central vascular congestion.  Interstitial prominence.  No focal consolidation, pleural effusion, or pneumothorax.  No definite nodule of small nodules could be obscured by the interstitial prominence.  No acute osseous finding.  IMPRESSION: Cardiomegaly with central vascular congestion.  Mild nonspecific interstitial prominence without focal consolidation.  Original Report Authenticated By: Waneta Martins, M.D.    EKG: Independently reviewed. Sinus rhythm rate 80 PR interval 0.12 QRS axis 50, cell T-wave elevations in V2 V3 V4 however it is a nonspecific. Some T wave inversions in lead 3. And flattening of aVF-no EKG for comparison   Active Problems:  * No active hospital problems. *     Assessment/Plan 1. Syncope related to uncontrollable cough-patient has probably strong vasovagal reflexes and this is also possibly related to orthostasis from multiple blood pressure medications. We'll get orthostatic blood pressures, hold antihypertensives other than low-dose amlodipine and reassess 2. AK I.-patient baseline creatinine 1.2 this is double this almost. We'll replete with IV fluids when (per hour and review.  His d-dimer is elevated and VQ scan has been ordered by emergency room physician. Given the fact he has no chest pain or pressure shortness breath feel this would be low yield and not necessarily test for this. If patient has further chest pain would proceed with test 3. Obstructive sleep apnea-patient needs to establish care with pulmonologist 4. Recent evaluation by cardiologist-patient's stress test recently that was totally negative. Please contact Dr. Jacinto Halim with these results in  the morning if needed  Code Status: Full Family Communication: Wife in room Disposition Plan: Melynda Ripple on [facility] long team 4, Dr.Davis  Pleas Koch, MD  [facility] Pager [phone]  If 8PM-8AM, please contact floor/night-coverage at [email or web address], password [facility] 04/16/2012, 12:27 AM

## 2012-04-16 NOTE — Progress Notes (Signed)
Assumed care of this pt at this time.  Pt sleeping but awakens easily, denies needs or concerns at this time.  Reviewed and agree with previous shift assessment.  Will continue to monitor pt.  Ardyth Gal, RN 04/16/2012

## 2012-04-17 ENCOUNTER — Observation Stay (HOSPITAL_COMMUNITY)
Admit: 2012-04-17 | Discharge: 2012-04-17 | Disposition: A | Discharge status: Home / Self Care | Payer: BC Managed Care – PPO | Attending: Internal Medicine | Admitting: Internal Medicine

## 2012-04-17 DIAGNOSIS — G4730 Sleep apnea, unspecified: Secondary | ICD-10-CM

## 2012-04-17 DIAGNOSIS — G4710 Hypersomnia, unspecified: Secondary | ICD-10-CM

## 2012-04-17 DIAGNOSIS — N179 Acute kidney failure, unspecified: Secondary | ICD-10-CM

## 2012-04-17 DIAGNOSIS — R55 Syncope and collapse: Secondary | ICD-10-CM

## 2012-04-17 DIAGNOSIS — I10 Essential (primary) hypertension: Secondary | ICD-10-CM

## 2012-04-17 LAB — BASIC METABOLIC PANEL
CO2: 29 mEq/L (ref 19–32)
Calcium: 9 mg/dL (ref 8.4–10.5)
Chloride: 99 mEq/L (ref 96–112)
GFR calc Af Amer: 62 mL/min — ABNORMAL LOW (ref >90)
Sodium: 135 mEq/L (ref 135–145)

## 2012-04-17 LAB — GLUCOSE, CAPILLARY: Glucose-Capillary: 70 mg/dL (ref 70–99)

## 2012-04-17 LAB — CBC
MCH: 24.4 pg — ABNORMAL LOW (ref 26.0–34.0)
Platelets: 153 10*3/uL (ref 150–400)
RBC: 6.96 MIL/uL — ABNORMAL HIGH (ref 4.22–5.81)
WBC: 5.4 10*3/uL (ref 4.0–10.5)

## 2012-04-17 MED ORDER — AMLODIPINE BESYLATE 2.5 MG PO TABS: 2.5000 mg | ORAL_TABLET | Freq: Every day | ORAL | Status: DC

## 2012-04-17 MED ORDER — METOPROLOL TARTRATE 25 MG PO TABS: 25.0000 mg | ORAL_TABLET | Freq: Two times a day (BID) | ORAL | Status: DC

## 2012-04-17 MED ORDER — ASPIRIN 81 MG PO TBEC: 81.0000 mg | DELAYED_RELEASE_TABLET | Freq: Every day | ORAL | Status: AC

## 2012-04-17 NOTE — Progress Notes (Signed)
Portable EEG completed at [facility]. 

## 2012-04-17 NOTE — Procedures (Signed)
EEG NUMBER:  REFERRING PHYSICIAN:  Dr. Earlene Plater.  HISTORY:  A 51 year old male with syncope evaluated to rule out seizures.  MEDICATIONS:  Norvasc, aspirin, Lopressor.  CONDITIONS OF RECORDING:  This is a 16 channel EEG carried out with the patient in the awake, drowsy, and asleep states.  DESCRIPTION:  The waking background activity consists of a low-voltage, symmetrical, fairly well-organized 8-9 Hz alpha activity seen from the parieto-occipital and posterotemporal regions.  Low-voltage, fast activity, poorly organized was seen anteriorly at times, superimposed on more posterior rhythms.  A mixture of theta and alpha was seen from the central and temporal regions.  The patient drowses with slowing to irregular which is theta and beta activity.  The patient goes into a light sleep with symmetrical sleep spindles, vertex with sharp activity and irregular slow activity.  Hyperventilation was not performed. Intermittent photic stimulation failed to elicit any change in the tracing.  IMPRESSION:  This is a normal EEG.          ______________________________ Thana Farr, MD    GE:XBMW D:  04/17/2012 18:48:11  T:  04/17/2012 21:30:37  Job #:  [id number]

## 2012-04-17 NOTE — Discharge Summary (Signed)
DISCHARGE SUMMARY  EON ZUNKER  MR#: [id number]  DOB:Jan 17, 1961  Date of Admission: 04/15/2012 Date of Discharge: 04/17/2012  Attending Physician:Iban Utz JARRETT  Patient's PCP:Dr Greggory Stallion Osiu Bonseu Consults:Dr [facility] phone consult  Discharge Diagnoses: Syncope  .Hypotension Acute renal failure Acute kidney injury Obstructive sleep apnea Mild rhabdomyolysis  Initial presentation: 51 yr old male was driving home from work today and staretd to cough uncontrollably while he was driving and was getting ready to pass out [place] [address].  He started to shake and felt like he was about to pass out, "I felt the blood rushing to my head"  NO h/o seizures or stroke.  [place], a woman was knocking on his window in the [place].  He had a syncopal episode in the past when varcisoe vein ruptured, another time he had tried to carry his wife when she had hurt her ACL. Another time this happened when he was on the commode strainign and passed out onto the bathroom floor.  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 He has had  NO cp,no nausea or vomiting.  States that his medications have changed recently to metoprolol bid  He was not confused when he came to  He has a history of sleep apnea for which he uses a CPAP at home.   Hospital Course: Syncope/hypotension/hypertension Patient was admitted to the hospital secondary to a syncopal episode. He was noted to be hypotensive. He most likely passed out secondary to hypotension from medication. His metoprolol will be reduced at the time of discharge. This was discussed with Dr. Jacinto Halim. He will follow up with Dr. Verl Dicker office to get a Holter monitor.Marland Kitchen He will followup today. He had a head CT scan done that was negative. A d-dimer was elevated followed by a VQ scan that was negative. Carotid Dopplers was negative. He had a stress test outpatient  recently so a 2-D echo was not done.  Acute kidney injury Patient's creatinine was elevated at the time of admission. Creatinine has trended back down to 1.49 at the time of discharge. Most likely reason for elevated creatinine is probably secondary to the hypotension.  Obstructive sleep apnea Patient wear's a CPAP at home he was continued on CPAP inpatient.  Mild rhabdomyolysis Patient's creatinine is slightly elevated. He stated that he had worked out with weight lifting. This is most likely the etiology of the elevated creatinine kinase and it is improving.   Medication List  As of 04/17/2012  9:46 AM   STOP taking these medications         TRIBENZOR 40-10-25 MG Tabs         TAKE these medications         amLODipine 2.5 MG tablet   Commonly known as: NORVASC   Take 1 tablet (2.5 mg total) by mouth daily.      aspirin 81 MG EC tablet   Take 1 tablet (81 mg total) by mouth daily. Swallow whole.      metoprolol tartrate 25 MG tablet   Commonly known as: LOPRESSOR   Take 1 tablet (25 mg total) by mouth 2 (two) times daily.      Vitamin D (Ergocalciferol) 50000 UNITS Caps   Commonly known as: DRISDOL   Take 50,000 Units by mouth See admin instructions. Take on sundays             Day  of Discharge BP 129/83  Pulse 79  Temp(Src) 98.3 F (36.8 C) (Oral)  Resp 20  Ht 6' (1.829 m)  Wt 136.9 kg (301 lb 13 oz)  BMI 40.93 kg/m2  SpO2 92%  Physical Exam:   Results for orders placed during the hospital encounter of 04/15/12 (from the past 24 hour(s))  CARDIAC PANEL(CRET KIN+CKTOT+MB+TROPI)     Status: Abnormal   Collection Time   04/16/12  9:55 AM      Component Value Range   Total CK 678 (*) 7 - 232 (U/L)   CK, MB 8.3 (*) 0.3 - 4.0 (ng/mL)   Troponin I <0.30  <0.30 (ng/mL)   Relative Index 1.2  0.0 - 2.5   CARDIAC PANEL(CRET KIN+CKTOT+MB+TROPI)     Status: Abnormal   Collection Time   04/16/12  6:07 PM      Component Value Range   Total CK 546 (*) 7 - 232 (U/L)     CK, MB 7.5 (*) 0.3 - 4.0 (ng/mL)   Troponin I <0.30  <0.30 (ng/mL)   Relative Index 1.4  0.0 - 2.5   CBC     Status: Abnormal   Collection Time   04/17/12  5:10 AM      Component Value Range   WBC 5.4  4.0 - 10.5 (K/uL)   RBC 6.96 (*) 4.22 - 5.81 (MIL/uL)   Hemoglobin 17.0  13.0 - 17.0 (g/dL)   HCT 08.6 (*) 57.8 - 52.0 (%)   MCV 78.6  78.0 - 100.0 (fL)   MCH 24.4 (*) 26.0 - 34.0 (pg)   MCHC 31.1  30.0 - 36.0 (g/dL)   RDW 46.9 (*) 62.9 - 15.5 (%)   Platelets 153  150 - 400 (K/uL)  BASIC METABOLIC PANEL     Status: Abnormal   Collection Time   04/17/12  5:10 AM      Component Value Range   Sodium 135  135 - 145 (mEq/L)   Potassium 4.2  3.5 - 5.1 (mEq/L)   Chloride 99  96 - 112 (mEq/L)   CO2 29  19 - 32 (mEq/L)   Glucose, Bld 83  70 - 99 (mg/dL)   BUN 14  6 - 23 (mg/dL)   Creatinine, Ser 5.28 (*) 0.50 - 1.35 (mg/dL)   Calcium 9.0  8.4 - 41.3 (mg/dL)   GFR calc non Af Amer 54 (*) >90 (mL/min)   GFR calc Af Amer 62 (*) >90 (mL/min)  GLUCOSE, CAPILLARY     Status: Normal   Collection Time   04/17/12  7:52 AM      Component Value Range   Glucose-Capillary 70  70 - 99 (mg/dL)    Disposition: Home   Follow-up Appts: Discharge Orders    Future Appointments: Provider: Department: Dept Phone: Center:   04/17/2012 12:00 PM [facility]  None     Future Orders Please Complete By Expires   Diet - low sodium heart healthy         Follow-up Information    Follow up with OSEI-BONSU,GEORGE, MD in 1 week.   Contact information:   [address] [place] [address] [phone]       Follow up with Pamella Pert, MD. (Today at 4:30PM)    Contact information:   [address]  [place] [address] [phone]       Follow up with Pamella Pert, MD on 05/01/2012. (at 3:30 PM)    Contact information:  [address]  [place] [address] [phone]          Tests Needing  Follow-up: Kidney function and creatinine kinase  Time spent in discharge (includes decision making & examination of pt): 60  minutes  Signed: Silver Parkey JARRETT 04/17/2012, 9:46 AM Pager [phone]

## 2012-04-22 ENCOUNTER — Encounter: Payer: Self-pay | Admitting: Internal Medicine

## 2013-02-26 ENCOUNTER — Emergency Department (HOSPITAL_COMMUNITY)
Admission: EM | Admit: 2013-02-26 | Discharge: 2013-02-26 | Discharge status: Absent without leave | Payer: Self-pay | Source: Home / Self Care

## 2014-05-09 IMAGING — CR DG CHEST 2V
2 series · 2 of 2 positions shown · non-contrast
Comparison: 05/18/2008

CLINICAL DATA: Syncope, fever, cough.

CHEST - 2 VIEW

[w chest pa]
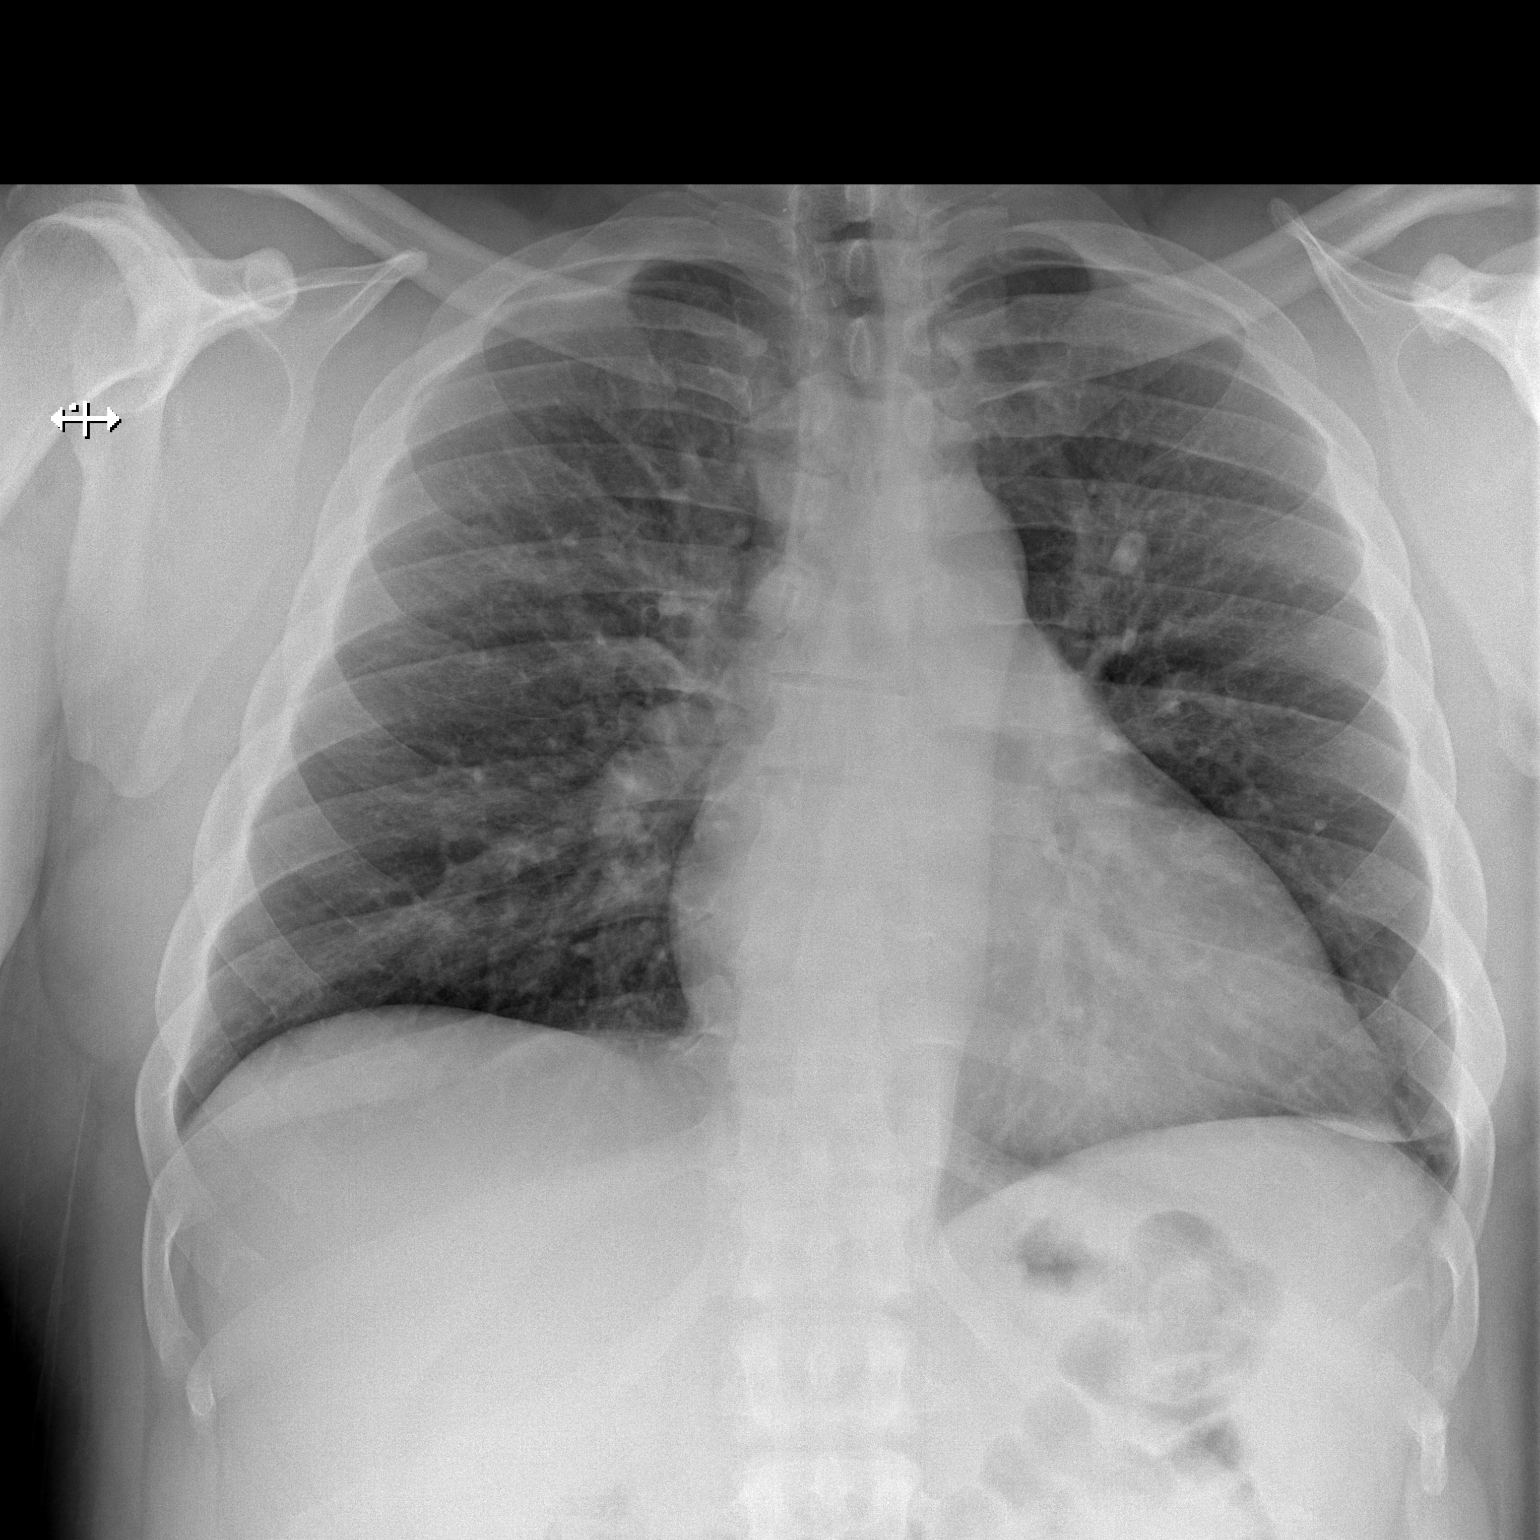

[w chest lat]
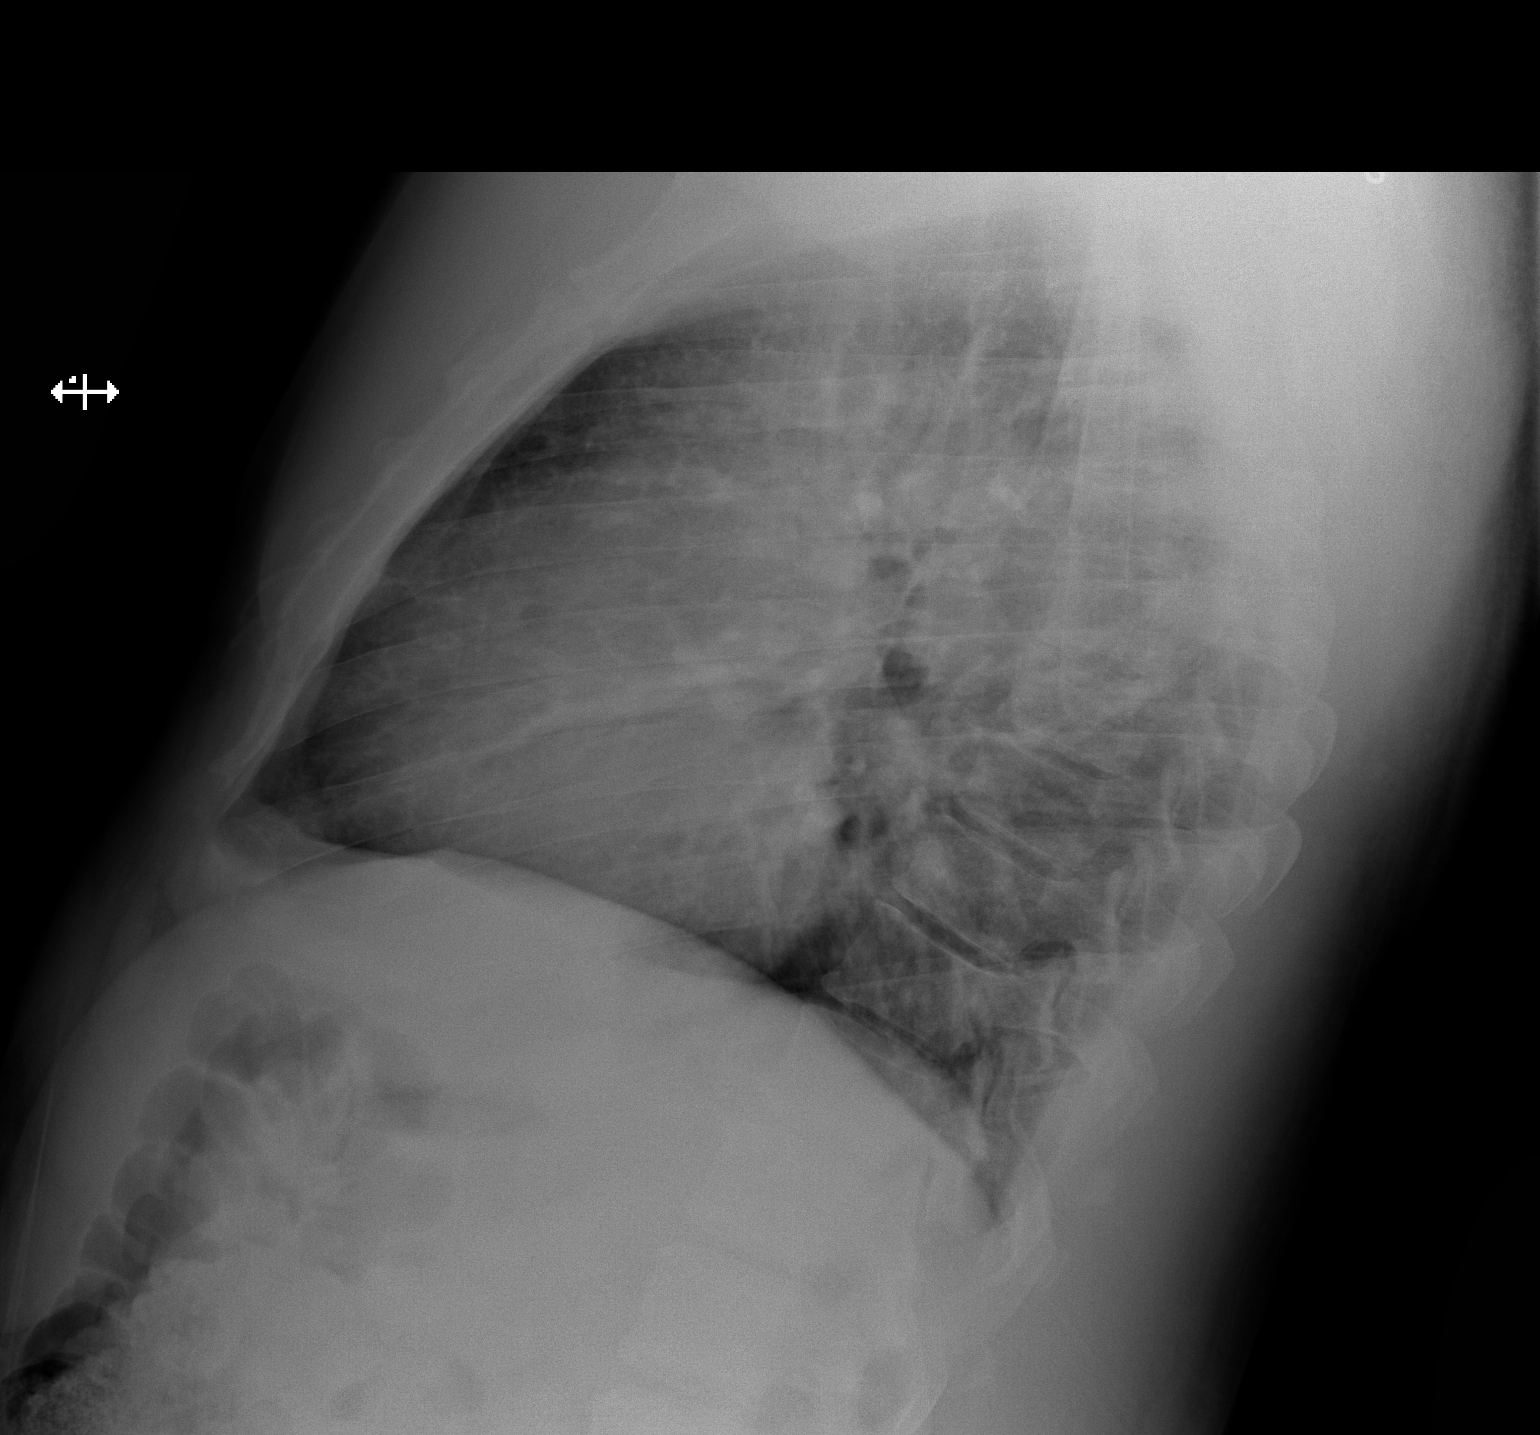

[2 of 2 positions shown; findings below may reference images not displayed]

FINDINGS: Cardiomegaly.  Central vascular congestion.  Interstitial
prominence.  No focal consolidation, pleural effusion, or
pneumothorax.  No definite nodule of small nodules could be
obscured by the interstitial prominence.  No acute osseous finding.
IMPRESSION: Cardiomegaly with central vascular congestion.

Mild nonspecific interstitial prominence without focal
consolidation.

## 2014-05-10 IMAGING — CT CT HEAD W/O CM
1 series · 16 of 30 positions shown, 20 images · non-contrast
Comparison: None.

CLINICAL DATA: Near syncope.  Hypertension

CT HEAD WITHOUT CONTRAST
TECHNIQUE: Contiguous axial images were obtained from the base of
the skull through the vertex without contrast.

[Series 2: head_seq 4.5 h37s st · axial · 0.43mm/px · z∈[-118,+30]mm · 16 of 36 slices shown, 20 images]
[im 2/36  brain]
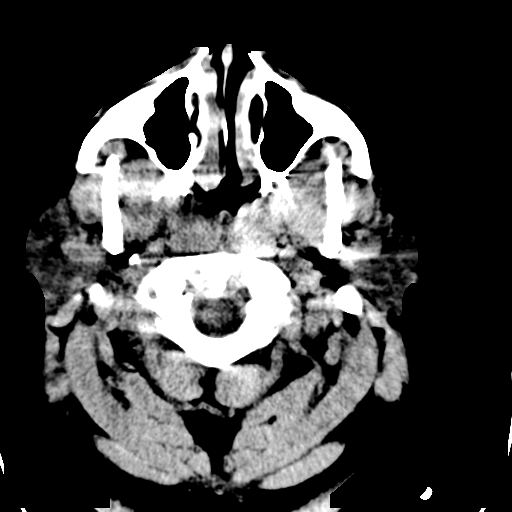
[im 2/36  bone]
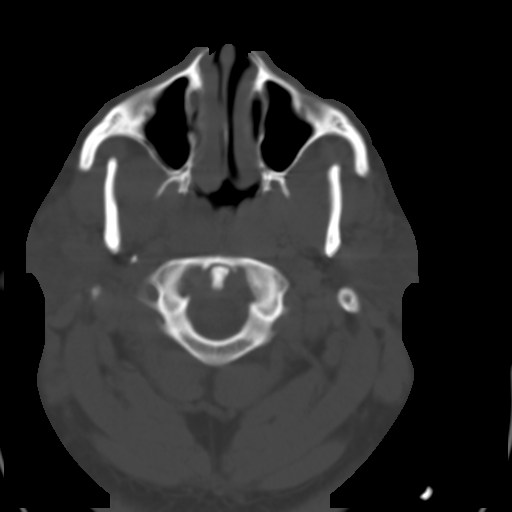
[im 4/36  brain]
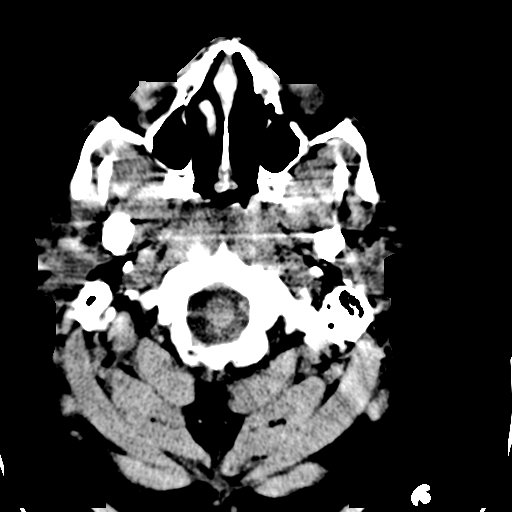
[im 7/36  brain]
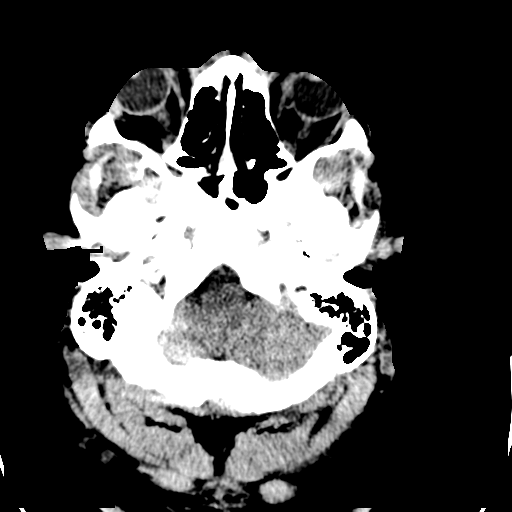
[im 9/36  brain]
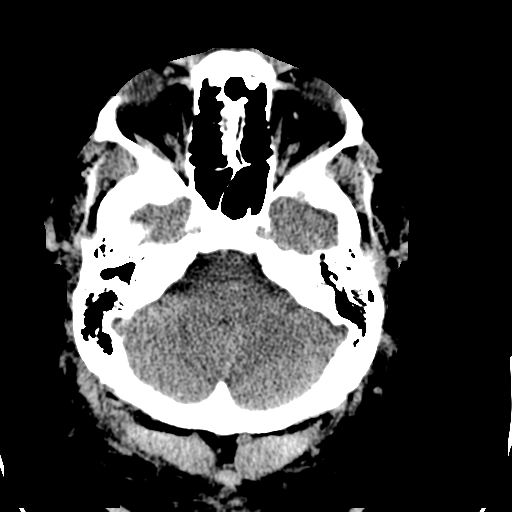
[im 10/36  brain]
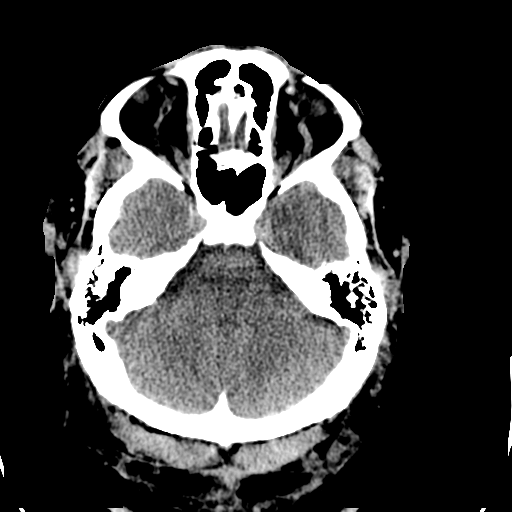
[im 10/36  bone]
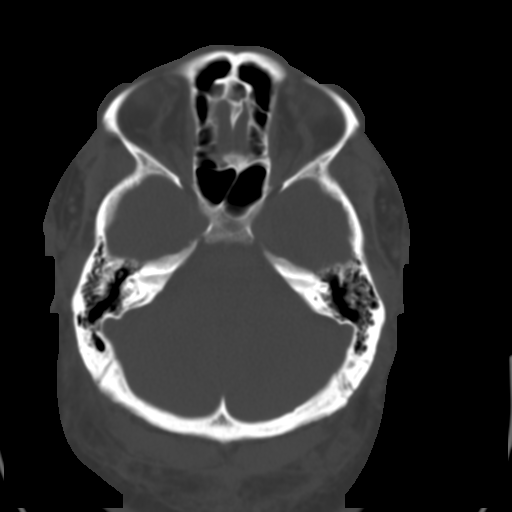
[im 13/36  brain]
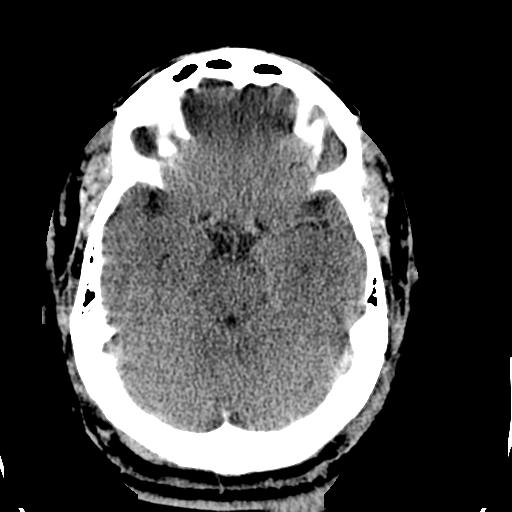
[im 15/36  brain]
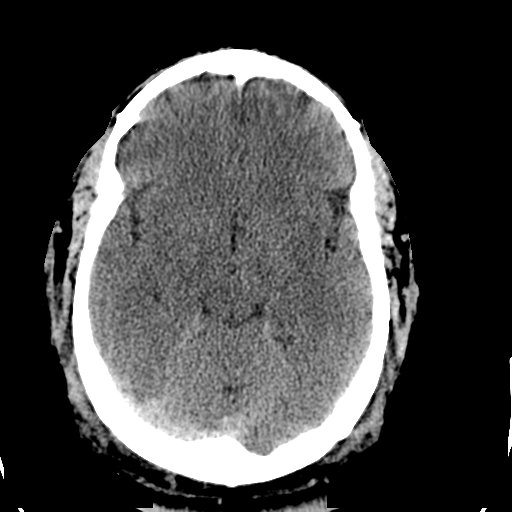
[im 17/36  brain]
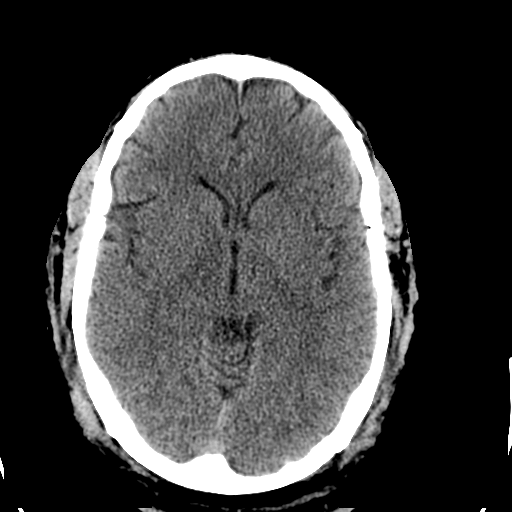
[im 19/36  brain]
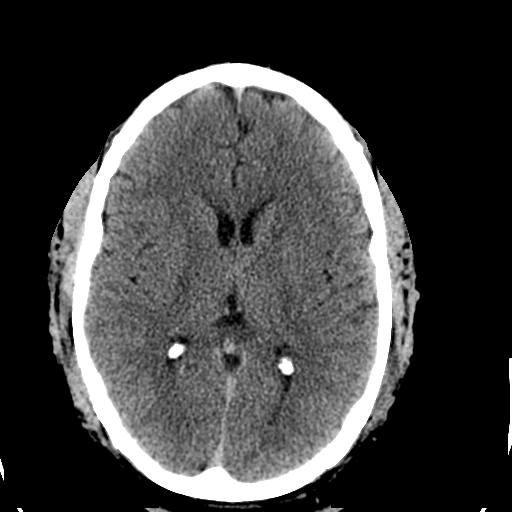
[im 19/36  bone]
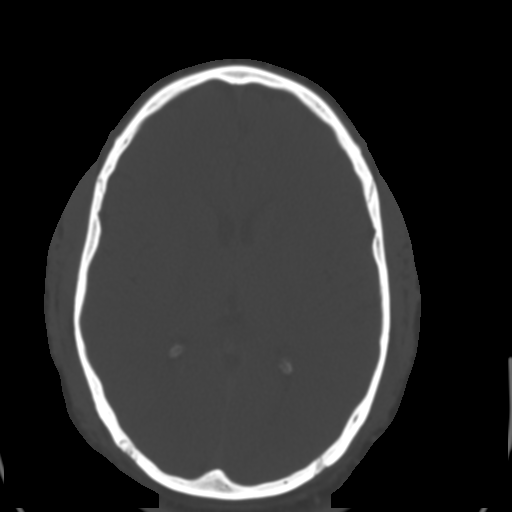
[im 21/36  brain]
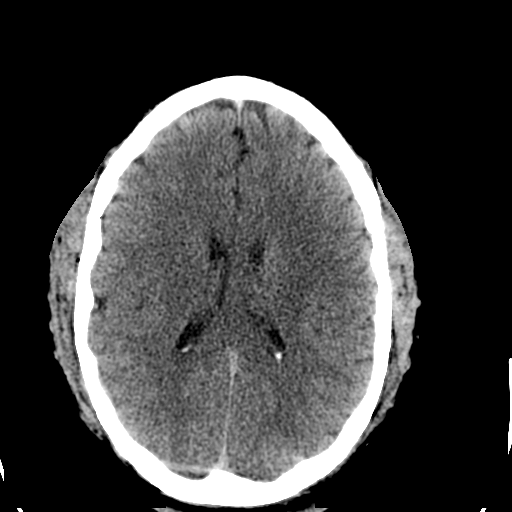
[im 23/36  brain]
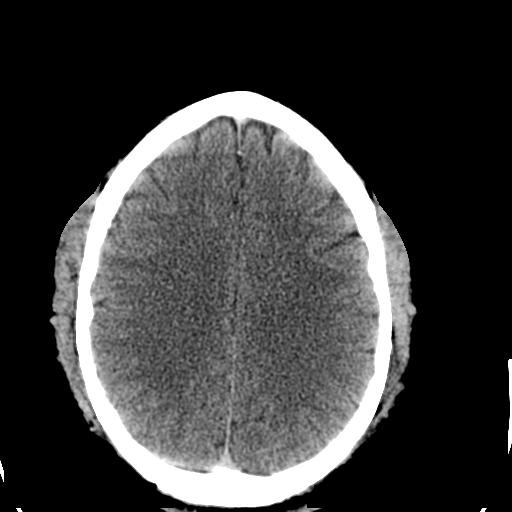
[im 26/36  brain]
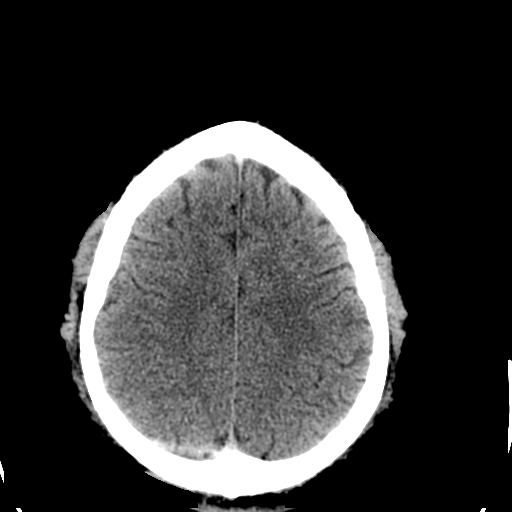
[im 27/36  brain]
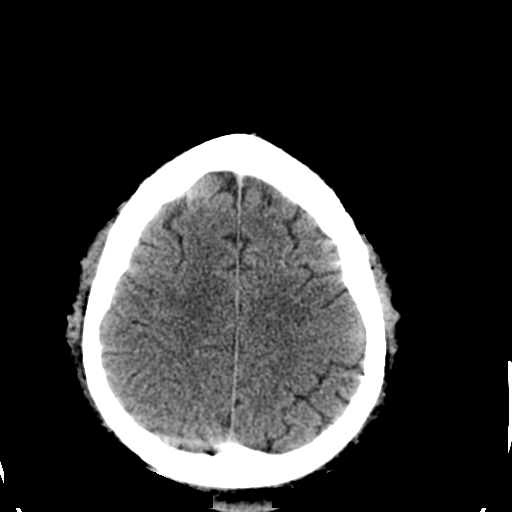
[im 27/36  bone]
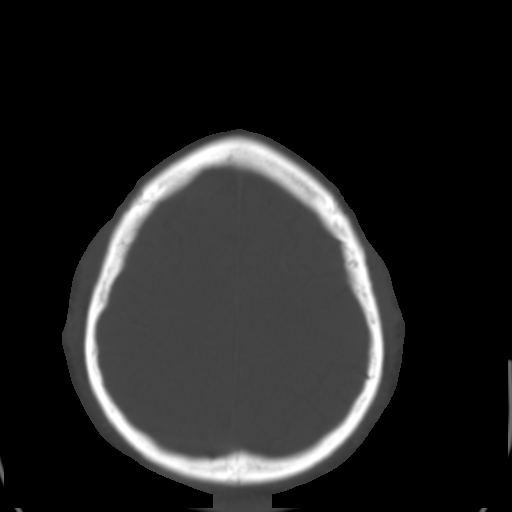
[im 29/36  brain]
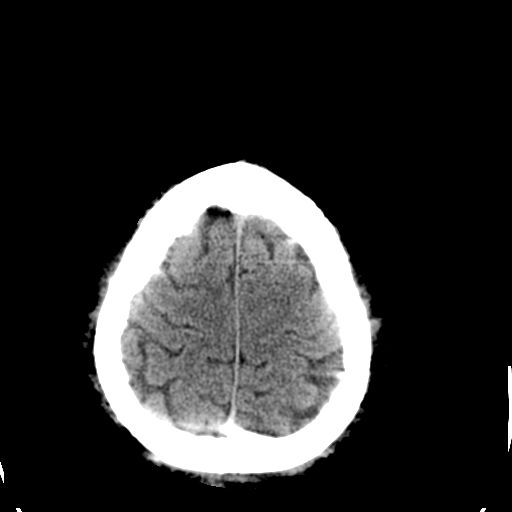
[im 32/36  brain]
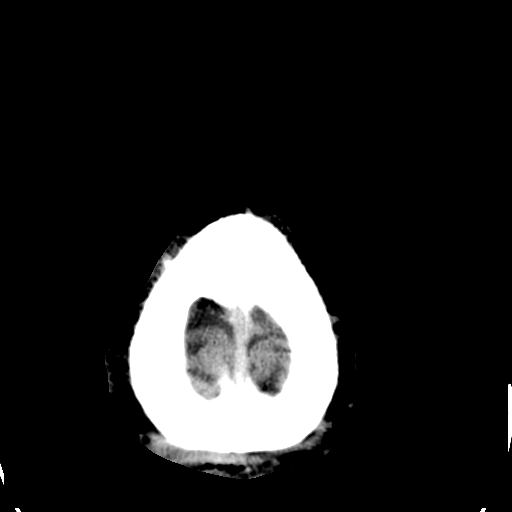
[im 34/36  brain]
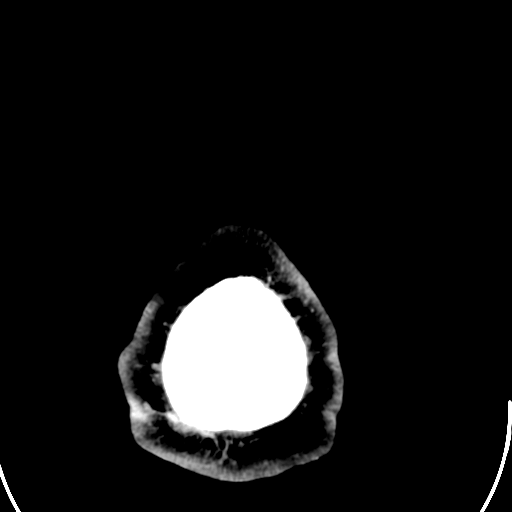

[16 of 30 positions shown; findings below may reference images not displayed]

FINDINGS: Ventricle size is normal.  Negative for infarct, mass, or
hemorrhage.  No edema or shift of the midline structures.
Calvarium is intact.
IMPRESSION: Normal examination.

## 2015-11-09 ENCOUNTER — Encounter (HOSPITAL_COMMUNITY): Payer: Self-pay | Admitting: *Deleted

## 2015-11-09 ENCOUNTER — Emergency Department (HOSPITAL_COMMUNITY)
Admission: EM | Admit: 2015-11-09 | Discharge: 2015-11-09 | Disposition: A | Discharge status: Home / Self Care | Payer: BLUE CROSS/BLUE SHIELD | Attending: Emergency Medicine | Admitting: Emergency Medicine

## 2015-11-09 DIAGNOSIS — I8393 Asymptomatic varicose veins of bilateral lower extremities: Secondary | ICD-10-CM | POA: Diagnosis present

## 2015-11-09 DIAGNOSIS — Z79899 Other long term (current) drug therapy: Secondary | ICD-10-CM | POA: Insufficient documentation

## 2015-11-09 DIAGNOSIS — Z8639 Personal history of other endocrine, nutritional and metabolic disease: Secondary | ICD-10-CM | POA: Insufficient documentation

## 2015-11-09 DIAGNOSIS — Z7982 Long term (current) use of aspirin: Secondary | ICD-10-CM | POA: Diagnosis not present

## 2015-11-09 DIAGNOSIS — I10 Essential (primary) hypertension: Secondary | ICD-10-CM | POA: Diagnosis not present

## 2015-11-09 DIAGNOSIS — I8390 Asymptomatic varicose veins of unspecified lower extremity: Secondary | ICD-10-CM

## 2015-11-09 DIAGNOSIS — Z9889 Other specified postprocedural states: Secondary | ICD-10-CM | POA: Diagnosis not present

## 2015-11-09 DIAGNOSIS — I878 Other specified disorders of veins: Secondary | ICD-10-CM

## 2015-11-09 HISTORY — DX: Disorder of thyroid, unspecified: E07.9

## 2015-11-09 HISTORY — DX: Asymptomatic varicose veins of unspecified lower extremity: I83.90

## 2015-11-09 NOTE — ED Notes (Signed)
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

## 2015-11-09 NOTE — ED Notes (Signed)
Pt stood no bleeding noted RN made aware

## 2015-11-09 NOTE — ED Notes (Signed)
Pt states "I stand up @ work all day long, I wear compression stockings & when I got home today my shoe was filled with blood.  I've had veins removed and been dealing with this 2-3 yrs."  Pt presents with leg wrapped in towel & 3 tourniquettes.  Tournequettes & towel removed, no bleeding noted, abd applied to site & wrapped with Kerlex.

## 2015-11-09 NOTE — Discharge Instructions (Signed)
Varicose Veins  Varicose veins are veins that have become enlarged and twisted. They are usually seen in the legs but can occur in other parts of the body as well.  CAUSES  This condition is the result of valves in the veins not working properly. Valves in the veins help to return blood from the leg to the heart. If these valves are damaged, blood flows backward and backs up into the veins in the leg near the skin. This causes the veins to become larger.  RISK FACTORS  People who are on their feet a lot, who are pregnant, or who are overweight are more likely to develop varicose veins.  SIGNS AND SYMPTOMS  · Bulging, twisted-appearing, bluish veins, most commonly found on the legs.  · Leg pain or a feeling of heaviness. These symptoms may be worse at the end of the day.  · Leg swelling.  · Changes in skin color.  DIAGNOSIS  A health care provider can usually diagnose varicose veins by examining your legs. Your health care provider may also recommend an ultrasound of your leg veins.  TREATMENT  Most varicose veins can be treated at home. However, other treatments are available for people who have persistent symptoms or want to improve the cosmetic appearance of the varicose veins. These treatment options include:  · Sclerotherapy. A solution is injected into the vein to close it off.  · Laser treatment. A laser is used to heat the vein to close it off.  · Radiofrequency vein ablation. An electrical current produced by radio waves is used to close off the vein.  · Phlebectomy. The vein is surgically removed through small incisions made over the varicose vein.  · Vein ligation and stripping. The vein is surgically removed through incisions made over the varicose vein after the vein has been tied (ligated).  HOME CARE INSTRUCTIONS  · Do not stand or sit in one position for long periods of time. Do not sit with your legs crossed. Rest with your legs raised during the day.  · Wear compression stockings as directed by your  health care provider. These stockings help to prevent blood clots and reduce swelling in your legs.  · Do not wear other tight, encircling garments around your legs, pelvis, or waist.  · Walk as much as possible to increase blood flow.  · Raise the foot of your bed at night with 2-inch blocks.  · If you get a cut in the skin over the vein and the vein bleeds, lie down with your leg raised and press on it with a clean cloth until the bleeding stops. Then place a bandage (dressing) on the cut. See your health care provider if it continues to bleed.  SEEK MEDICAL CARE IF:  · The skin around your ankle starts to break down.  · You have pain, redness, tenderness, or hard swelling in your leg over a vein.  · You are uncomfortable because of leg pain.     This information is not intended to replace advice given to you by your health care provider. Make sure you discuss any questions you have with your health care provider.     Document Released: 08/22/2005 Document Revised: 12/03/2014 Document Reviewed: 03/30/2014  Elsevier Interactive Patient Education ©2016 [employer]    Venous Stasis or Chronic Venous Insufficiency  Chronic venous insufficiency, also called venous stasis, is a condition that affects the veins in the legs. The condition prevents blood from being pumped through these veins effectively.   Blood may no longer be pumped effectively from the legs back to the heart. This condition can range from mild to severe. With proper treatment, you should be able to continue with an active life.  CAUSES   Chronic venous insufficiency occurs when the vein walls become stretched, weakened, or damaged or when valves within the vein are damaged. Some common causes of this include:  · High blood pressure inside the veins (venous hypertension).  · Increased blood pressure in the leg veins from long periods of sitting or standing.  · A blood clot that blocks blood flow in a vein (deep vein thrombosis).  · Inflammation of a  superficial vein (phlebitis) that causes a blood clot to form.  RISK FACTORS  Various things can make you more likely to develop chronic venous insufficiency, including:  · Family history of this condition.  · Obesity.  · Pregnancy.  · Sedentary lifestyle.  · Smoking.  · Jobs requiring long periods of standing or sitting in one place.  · Being a certain age. Women in their 40s and 50s and men in their 70s are more likely to develop this condition.  SIGNS AND SYMPTOMS   Symptoms may include:   · Varicose veins.  · Skin breakdown or ulcers.  · Reddened or discolored skin on the leg.  · Brown, smooth, tight, and painful skin just above the ankle, usually on the inside surface (lipodermatosclerosis).  · Swelling.  DIAGNOSIS   To diagnose this condition, your health care provider will take a medical history and do a physical exam. The following tests may be ordered to confirm the diagnosis:  · Duplex ultrasound--A procedure that produces a picture of a blood vessel and nearby organs and also provides information on blood flow through the blood vessel.  · Plethysmography--A procedure that tests blood flow.  · A venogram, or venography--A procedure used to look at the veins using X-ray and dye.  TREATMENT  The goals of treatment are to help you return to an active life and to minimize pain or disability. Treatment will depend on the severity of the condition. Medical procedures may be needed for severe cases. Treatment options may include:   · Use of compression stockings. These can help with symptoms and lower the chances of the problem getting worse, but they do not cure the problem.  · Sclerotherapy--A procedure involving an injection of a material that "dissolves" the damaged veins. Other veins in the network of blood vessels take over the function of the damaged veins.  · Surgery to remove the vein or cut off blood flow through the vein (vein stripping or laser ablation surgery).  · Surgery to repair a valve.  HOME  CARE INSTRUCTIONS   · Wear compression stockings as directed by your health care provider.  · Only take over-the-counter or prescription medicines for pain, discomfort, or fever as directed by your health care provider.  · Follow up with your health care provider as directed.  SEEK MEDICAL CARE IF:   · You have redness, swelling, or increasing pain in the affected area.  · You see a red streak or line that extends up or down from the affected area.  · You have a breakdown or loss of skin in the affected area, even if the breakdown is small.  · You have an injury to the affected area.  SEEK IMMEDIATE MEDICAL CARE IF:   · You have an injury and open wound in the affected area.  · Your   pain is severe and does not improve with medicine.  · You have sudden numbness or weakness in the foot or ankle below the affected area, or you have trouble moving your foot or ankle.  · You have a fever or persistent symptoms for more than 2-3 days.  · You have a fever and your symptoms suddenly get worse.  MAKE SURE YOU:   · Understand these instructions.  · Will watch your condition.  · Will get help right away if you are not doing well or get worse.     This information is not intended to replace advice given to you by your health care provider. Make sure you discuss any questions you have with your health care provider.     Document Released: 03/18/2007 Document Revised: 09/02/2013 Document Reviewed: 07/20/2013  Elsevier Interactive Patient Education ©2016 [employer]

## 2015-11-09 NOTE — ED Notes (Signed)
Pt provided non-skid footwear.  Pt ambulated to BR.

## 2015-11-09 NOTE — ED Provider Notes (Signed)
CSN: [id number]     Arrival date & time 11/09/15  1957 History   First MD Initiated Contact with Patient 11/09/15 2019     Chief Complaint  Patient presents with  . Vein Bleeding      (Consider location/radiation/quality/duration/timing/severity/associated sxs/prior Treatment) HPI   Riley Coleman is a 54 y.o. male with PMH significant for HTN, varicose veins, and thyroid disease who presents with 1 day history of moderate, constant, worsening right lower extremity bleeding.  Patient reports 2-3 history of varicose veins and venous stasis.  He has been seen by [facility], but states he has not seen them in about 1.5 years.  He reports he stands a lot at work.  He states he does wear compression stockings every day.  Today, he states he got home from work and his shoe was filled with blood.  He since elevated his foot and applied compression dressings and tourniquettes which provided moderate relief.  Denies fevers, chills, abnormal leg swelling, drainage, redness, or pain.  He reports he takes 81 mg ASA.   Past Medical History  Diagnosis Date  . Hypertension   . Varicose vein of leg   . Thyroid disease    Past Surgical History  Procedure Laterality Date  . Varicose vein surgery    . Neck surgery     No family history on file. Social History  Substance Use Topics  . Smoking status: Never Smoker   . Smokeless tobacco: None  . Alcohol Use: Yes     Comment: rarely    Review of Systems All other systems negative unless otherwise stated in HPI    Allergies  Review of patient's allergies indicates no known allergies.  Home Medications   Prior to Admission medications   Medication Sig Start Date End Date Taking? Authorizing Provider  aspirin 81 MG chewable tablet Chew 81 mg by mouth daily.    Yes Historical Provider, MD  Multiple Vitamins-Minerals (MULTIVITAMIN & MINERAL PO) Take 1 tablet by mouth daily.   Yes Historical Provider, MD   tadalafil (CIALIS) 20 MG tablet Take 20 mg by mouth daily as needed for erectile dysfunction.  03/23/14  Yes Historical Provider, MD  TRIBENZOR 40-10-12.5 MG TABS TK 1 T PO D 09/15/15  Yes Historical Provider, MD  amLODipine (NORVASC) 2.5 MG tablet Take 1 tablet (2.5 mg total) by mouth daily. 04/17/12 04/17/13  Alinda Money, MD  metoprolol (LOPRESSOR) 25 MG tablet Take 1 tablet (25 mg total) by mouth 2 (two) times daily. Patient not taking: Reported on 11/09/2015 04/17/12   Alinda Money, MD   BP 127/84 mmHg  Pulse 87  Temp(Src) 98 F (36.7 C) (Oral)  Resp 18  SpO2 97% Physical Exam  Constitutional: He is oriented to person, place, and time. He appears well-developed and well-nourished.  HENT:  Head: Normocephalic and atraumatic.  Mouth/Throat: Oropharynx is clear and moist.  Eyes: Conjunctivae are normal.  Neck: Normal range of motion. Neck supple.  Cardiovascular: Normal rate, regular rhythm and normal heart sounds.   No murmur heard. Pulses:      Dorsalis pedis pulses are 2+ on the right side, and 2+ on the left side.  Bilateral lower extremity 2+ edema  Pulmonary/Chest: Effort normal and breath sounds normal. No accessory muscle usage or stridor. No respiratory distress. He has no wheezes. He has no rhonchi. He has no rales.  Abdominal: Soft. Bowel sounds are normal. He exhibits no distension. There is no tenderness.  Musculoskeletal: Normal range of motion.  Lymphadenopathy:    He has no cervical adenopathy.  Neurological: He is alert and oriented to person, place, and time.  Speech clear without dysarthria.  Skin: Skin is warm and dry. No abrasion, no bruising, no ecchymosis, no lesion and no rash noted. No erythema. No pallor.  Skin is warm and intact.  No bleeding noted.  No visualization of prior source of bleeding. No erythema, drainage, induration, fluctuance, or signs of infection.   Psychiatric: He has a normal mood and affect. His behavior is normal.    ED Course   Procedures (including critical care time) Labs Review Labs Reviewed - No data to display  Imaging Review No results found. I have personally reviewed and evaluated these images and lab results as part of my medical decision-making.   EKG Interpretation None      MDM   Final diagnoses:  Varicose veins  Venous stasis    Patient presents with vascular issues secondary to venous stasis and varicose veins.  Patient wears compression stockings daily and was last seen by [facility] 1.5 years ago.  VSS, NAD.  On exam, NVI.  Bilateral lower extremity swelling.  No visualization of bleeding source and no active bleeding.  No signs of infection.  Will ambulate patient and observe for bleeding.  No bleeding observed in the ED.  Patient stable for discharge.  Follow up with [facility].      Cheri FowlerKayla Jenise Iannelli, PA-C 11/09/15 2259  Alvira MondayErin Schlossman, MD 11/13/15 [phone]

## 2016-12-24 DIAGNOSIS — F553 Abuse of steroids or hormones: Secondary | ICD-10-CM | POA: Insufficient documentation

## 2016-12-24 DIAGNOSIS — N522 Drug-induced erectile dysfunction: Secondary | ICD-10-CM | POA: Insufficient documentation

## 2018-01-03 DIAGNOSIS — R7302 Impaired glucose tolerance (oral): Secondary | ICD-10-CM | POA: Insufficient documentation

## 2018-01-03 DIAGNOSIS — N183 Chronic kidney disease, stage 3 unspecified: Secondary | ICD-10-CM | POA: Insufficient documentation

## 2018-07-26 ENCOUNTER — Emergency Department (HOSPITAL_COMMUNITY)
Admission: EM | Admit: 2018-07-26 | Discharge: 2018-07-26 | Disposition: A | Discharge status: Home / Self Care | Payer: BLUE CROSS/BLUE SHIELD | Attending: Emergency Medicine | Admitting: Emergency Medicine

## 2018-07-26 ENCOUNTER — Other Ambulatory Visit: Payer: Self-pay

## 2018-07-26 ENCOUNTER — Encounter (HOSPITAL_COMMUNITY): Payer: Self-pay

## 2018-07-26 DIAGNOSIS — S60911A Unspecified superficial injury of right wrist, initial encounter: Secondary | ICD-10-CM | POA: Insufficient documentation

## 2018-07-26 DIAGNOSIS — Y999 Unspecified external cause status: Secondary | ICD-10-CM | POA: Diagnosis not present

## 2018-07-26 DIAGNOSIS — Z79899 Other long term (current) drug therapy: Secondary | ICD-10-CM | POA: Insufficient documentation

## 2018-07-26 DIAGNOSIS — I129 Hypertensive chronic kidney disease with stage 1 through stage 4 chronic kidney disease, or unspecified chronic kidney disease: Secondary | ICD-10-CM | POA: Diagnosis not present

## 2018-07-26 DIAGNOSIS — Y939 Activity, unspecified: Secondary | ICD-10-CM | POA: Insufficient documentation

## 2018-07-26 DIAGNOSIS — N189 Chronic kidney disease, unspecified: Secondary | ICD-10-CM | POA: Diagnosis not present

## 2018-07-26 DIAGNOSIS — Y929 Unspecified place or not applicable: Secondary | ICD-10-CM | POA: Diagnosis not present

## 2018-07-26 MED ORDER — METHOCARBAMOL 500 MG PO TABS: 500.0000 mg | ORAL_TABLET | Freq: Two times a day (BID) | ORAL | 0 refills | Status: DC
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
## 2018-08-18 ENCOUNTER — Telehealth: Payer: Self-pay | Admitting: General Practice

## 2018-08-18 NOTE — Telephone Encounter (Signed)
Elbie dropped off [employer] ppw to be filled out concerning mother Frances Guild DOB 06/15/1941. I placed ppw in blue folder and left on Robin's keyboard. Pt request ppw to be faxed when completed. °

## 2018-08-19 NOTE — Telephone Encounter (Signed)
In wrong chart  

## 2018-08-19 NOTE — Telephone Encounter (Signed)
Juell dropped off [employer] ppw to be filled out concerning mother Frances Szczepanik DOB 06/15/1941. I placed ppw in blue folder and left on Robin's keyboard. Pt request ppw to be faxed when completed. °

## 2018-09-12 NOTE — Telephone Encounter (Signed)
Called back in to follow up on FMLA paperwork. He said that claim will be terminated at 12 midnight if they dont receive ppwk  Please call to give status update  CB: [phone]

## 2018-09-12 NOTE — Telephone Encounter (Signed)
Again this is in wrong chart  Should be pt mother frances costlow,  Trey Paula is aware paperwork was faxed 08/23/18 and I just refaxed this again 10/18

## 2018-11-05 DIAGNOSIS — Z6841 Body Mass Index (BMI) 40.0 and over, adult: Secondary | ICD-10-CM | POA: Insufficient documentation

## 2019-09-07 ENCOUNTER — Other Ambulatory Visit: Payer: Self-pay

## 2019-09-07 ENCOUNTER — Ambulatory Visit: Payer: BC Managed Care – PPO | Admitting: Podiatry

## 2019-09-07 ENCOUNTER — Encounter: Payer: Self-pay | Admitting: Podiatry

## 2019-09-07 ENCOUNTER — Ambulatory Visit (INDEPENDENT_AMBULATORY_CARE_PROVIDER_SITE_OTHER): Payer: BC Managed Care – PPO

## 2019-09-07 DIAGNOSIS — M21619 Bunion of unspecified foot: Secondary | ICD-10-CM | POA: Diagnosis not present

## 2019-09-07 DIAGNOSIS — B351 Tinea unguium: Secondary | ICD-10-CM | POA: Diagnosis not present

## 2019-09-07 DIAGNOSIS — M722 Plantar fascial fibromatosis: Secondary | ICD-10-CM

## 2019-09-07 MED ORDER — DICLOFENAC SODIUM 75 MG PO TBEC: 75.0000 mg | DELAYED_RELEASE_TABLET | Freq: Two times a day (BID) | ORAL | 2 refills | Status: AC

## 2019-09-07 NOTE — Progress Notes (Signed)
   Subjective:    Patient ID: Riley Coleman, male    DOB: 1961/10/06, 58 y.o.   MRN: [id number]  HPI    Review of Systems  All other systems reviewed and are negative.      Objective:   Physical Exam        Assessment & Plan:

## 2019-09-07 NOTE — Patient Instructions (Signed)

## 2019-09-09 NOTE — Progress Notes (Signed)
Subjective:   Patient ID: Riley Coleman, male   DOB: 58 y.o.   MRN: [id number]   HPI Patient presents with painful pain in the bottom of the right heel stating that it is been going on now for at least a month and probably longer and also has structural bunion which at times can be bothersome and thick nails with patient working on cement floor steel toe shoes.  Patient states the pain is quite intense in the heel and is getting worse and patient does not smoke likes to be active   Review of Systems  All other systems reviewed and are negative.       Objective:  Physical Exam Vitals signs and nursing note reviewed.  Constitutional:      Appearance: He is well-developed.  Pulmonary:     Effort: Pulmonary effort is normal.  Musculoskeletal: Normal range of motion.  Skin:    General: Skin is warm.  Neurological:     Mental Status: He is alert.     Neurovascular status intact muscle strength found to be adequate range of motion within normal limits with patient found to have flatfoot deformity bilateral and exquisite discomfort of the plantar heel region right with inflammation fluid around the medial band and also is noted to have moderate structural bunion deformities and thick toenail deformity 1-5 both feet     Assessment:  Acute plantar fasciitis right with inflammation fluid buildup along with mycotic nail infection and also bunion deformity     Plan:  H&P reviewed all conditions and today I went ahead and I am get a focus on the heel after education concerning bunions and nail disease.  I went ahead did sterile prep injected the fascia 3 mg Kenalog 5 mg Xylocaine applied fascial brace to lift up the arch and patient will be seen back for [place] to recheck again in 2 weeks or earlier if needed  X-rays indicate structural bunion deformity of a significant nature bilateral with plantar spur formation moderate depression of the arch with no indications of advanced arthritis

## 2019-09-21 ENCOUNTER — Ambulatory Visit: Payer: BC Managed Care – PPO | Admitting: Podiatry

## 2019-09-21 ENCOUNTER — Other Ambulatory Visit: Payer: Self-pay

## 2019-09-21 ENCOUNTER — Encounter: Payer: Self-pay | Admitting: Podiatry

## 2019-09-21 DIAGNOSIS — M722 Plantar fascial fibromatosis: Secondary | ICD-10-CM

## 2019-09-23 NOTE — Progress Notes (Signed)
Subjective:   Patient ID: Riley Coleman, male   DOB: 58 y.o.   MRN: [id number]   HPI Patient states overall improving but still having pain in the bottom of the heel if he has been on it too long   ROS      Objective:  Physical Exam  Neurovascular status intact with patient's heel improved but still painful with full ambulation with patient working on cement floors and found to be flat-footed     Assessment:  Plantar fasciitis improved but still present and still painful     Plan:  H&P reviewed conditions and at this point did advise on anti-inflammatories physical therapy and because of the fact he is on cement floors custom orthotics and he is scanned for customized orthotic devices today.  Patient will be seen back when returned

## 2019-10-19 ENCOUNTER — Other Ambulatory Visit: Payer: BC Managed Care – PPO | Admitting: Orthotics

## 2019-10-19 ENCOUNTER — Other Ambulatory Visit: Payer: Self-pay

## 2019-11-30 ENCOUNTER — Other Ambulatory Visit: Payer: Self-pay | Admitting: Podiatry

## 2024-09-29 ENCOUNTER — Encounter (HOSPITAL_COMMUNITY): Payer: Self-pay

## 2024-09-29 ENCOUNTER — Emergency Department (HOSPITAL_COMMUNITY)
Admission: EM | Admit: 2024-09-29 | Discharge: 2024-09-29 | Disposition: A | Discharge status: Home / Self Care | Attending: Emergency Medicine | Admitting: Emergency Medicine

## 2024-09-29 ENCOUNTER — Emergency Department (HOSPITAL_COMMUNITY)

## 2024-09-29 ENCOUNTER — Other Ambulatory Visit: Payer: Self-pay

## 2024-09-29 DIAGNOSIS — M546 Pain in thoracic spine: Secondary | ICD-10-CM | POA: Diagnosis not present

## 2024-09-29 DIAGNOSIS — S39012A Strain of muscle, fascia and tendon of lower back, initial encounter: Secondary | ICD-10-CM | POA: Insufficient documentation

## 2024-09-29 DIAGNOSIS — Z79899 Other long term (current) drug therapy: Secondary | ICD-10-CM | POA: Insufficient documentation

## 2024-09-29 DIAGNOSIS — M545 Low back pain, unspecified: Secondary | ICD-10-CM | POA: Diagnosis present

## 2024-09-29 DIAGNOSIS — I10 Essential (primary) hypertension: Secondary | ICD-10-CM | POA: Insufficient documentation

## 2024-09-29 DIAGNOSIS — Z7982 Long term (current) use of aspirin: Secondary | ICD-10-CM | POA: Insufficient documentation

## 2024-09-29 DIAGNOSIS — X509XXA Other and unspecified overexertion or strenuous movements or postures, initial encounter: Secondary | ICD-10-CM | POA: Diagnosis not present

## 2024-09-29 DIAGNOSIS — Y990 Civilian activity done for income or pay: Secondary | ICD-10-CM | POA: Insufficient documentation

## 2024-09-29 LAB — URINALYSIS, ROUTINE W REFLEX MICROSCOPIC
Bilirubin Urine: NEGATIVE
Glucose, UA: NEGATIVE mg/dL
Hgb urine dipstick: NEGATIVE
Ketones, ur: NEGATIVE mg/dL
Leukocytes,Ua: NEGATIVE
Nitrite: NEGATIVE
Protein, ur: NEGATIVE mg/dL
Specific Gravity, Urine: 1.023 (ref 1.005–1.030)
pH: 5 (ref 5.0–8.0)

## 2024-09-29 LAB — CBC
HCT: 43.8 % (ref 39.0–52.0)
Hemoglobin: 14.8 g/dL (ref 13.0–17.0)
MCH: 29.1 pg (ref 26.0–34.0)
MCHC: 33.8 g/dL (ref 30.0–36.0)
MCV: 86.1 fL (ref 80.0–100.0)
Platelets: 144 K/uL — ABNORMAL LOW (ref 150–400)
RBC: 5.09 MIL/uL (ref 4.22–5.81)
RDW: 12.4 % (ref 11.5–15.5)
WBC: 4.6 K/uL (ref 4.0–10.5)
nRBC: 0 % (ref 0.0–0.2)

## 2024-09-29 LAB — BASIC METABOLIC PANEL WITH GFR
Anion gap: 10 (ref 5–15)
BUN: 15 mg/dL (ref 8–23)
CO2: 26 mmol/L (ref 22–32)
Calcium: 8.3 mg/dL — ABNORMAL LOW (ref 8.9–10.3)
Chloride: 100 mmol/L (ref 98–111)
Creatinine, Ser: 1.25 mg/dL — ABNORMAL HIGH (ref 0.61–1.24)
GFR, Estimated: >60 mL/min (ref >60)
Glucose, Bld: 104 mg/dL — ABNORMAL HIGH (ref 70–99)
Potassium: 3.9 mmol/L (ref 3.5–5.1)
Sodium: 136 mmol/L (ref 135–145)

## 2024-09-29 MED ORDER — KETOROLAC TROMETHAMINE 60 MG/2ML IM SOLN
30.0000 mg | Freq: Once | INTRAMUSCULAR | Status: AC
  Administered 2024-09-29: 30 mg via INTRAMUSCULAR
  Filled 2024-09-29: qty 2

## 2024-09-29 NOTE — ED Provider Notes (Signed)
 [place] EMERGENCY DEPARTMENT AT [facility] Provider Note  CSN: [id number] Arrival date & time: 09/29/24 1746  Chief Complaint(s) Back Pain  HPI Riley Coleman is a 63 y.o. 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 Authorizing Provider  aspirin  81 MG chewable tablet Chew 81 mg by mouth daily.     [provider]  clindamycin (CLINDAGEL) 1 % gel Apply 1 application topically 2 (two) times daily.    [provider]  Desoximetasone (TOPICORT) 0.25 % LIQD Apply topically.    [provider]  diclofenac  (VOLTAREN ) 75 MG EC tablet Take 1 tablet (75 mg total) by mouth 2 (two) times daily. 09/07/19    Magdalen Pasco RAMAN, DPM  Multiple Vitamins-Minerals (MULTIVITAMIN & MINERAL PO) Take 1 tablet by mouth daily.    [provider]  Olmesartan-amLODIPine -HCTZ 40-10-12.5 MG TABS Take 1 tablet by mouth daily.    [provider]  tadalafil (CIALIS) 20 MG tablet Take 20 mg by mouth daily as needed for erectile dysfunction.  03/23/14   [provider]                                                                                                                                    Allergies Patient has no known allergies.  Review of Systems Review of Systems  Genitourinary:  Negative for dysuria.  Musculoskeletal:  Positive for back pain.  Neurological:  Negative for numbness and paresthesias.   As noted in HPI  Physical Exam Vital Signs  I have reviewed the triage vital signs BP 108/79   Pulse 72  Temp 98.9 F (37.2 C) (Oral)   Resp 15   SpO2 100%   Physical Exam Vitals reviewed.  Constitutional:      General: He is not in acute distress.    Appearance: He is well-developed. He is not diaphoretic.  HENT:     Head: Normocephalic and atraumatic.     Right Ear: External ear normal.     Left Ear: External ear normal.     Nose: Nose normal.     Mouth/Throat:     Mouth: Mucous membranes are moist.  Eyes:     General: No scleral icterus.    Conjunctiva/sclera: Conjunctivae normal.  Neck:     Trachea: Phonation normal.  Cardiovascular:     Rate and Rhythm: Normal rate and regular rhythm.  Pulmonary:     Effort: Pulmonary effort is normal. No respiratory distress.     Breath sounds: No stridor.  Abdominal:     General: There is no distension.  Musculoskeletal:        General: Normal range of motion.     Cervical back: Normal range of motion.     Thoracic back: Tenderness present.     Lumbar back: Tenderness present.       Back:  Neurological:     Mental Status: He is alert and oriented to person, place, and time.  Psychiatric:        Behavior:  Behavior normal.     ED Results and Treatments Labs (all labs ordered are listed, but only abnormal results are displayed) Labs Reviewed  BASIC METABOLIC PANEL WITH GFR - Abnormal; Notable for the following components:      Result Value   Glucose, Bld 104 (*)    Creatinine, Ser 1.25 (*)    Calcium 8.3 (*)    All other components within normal limits  CBC - Abnormal; Notable for the following components:   Platelets 144 (*)    All other components within normal limits  URINALYSIS, ROUTINE W REFLEX MICROSCOPIC                                                                                                                         EKG  EKG Interpretation Date/Time:  Tuesday September 29 2024 23:08:31 EST Ventricular Rate:  78 PR Interval:  172 QRS Duration:  104 QT Interval:  412 QTC Calculation: 470 R Axis:   58  Text Interpretation: Sinus rhythm Consider left atrial enlargement Confirmed by Trine Likes [phone]) on 09/29/2024 11:37:50 PM       Radiology CT Renal Stone Study Result Date: 09/29/2024 EXAM: CT ABDOMEN AND PELVIS WITHOUT CONTRAST 09/29/2024 07:23:00 PM TECHNIQUE: CT of the abdomen and pelvis was performed without the administration of intravenous contrast. Multiplanar reformatted images are provided for review. Automated exposure control, iterative reconstruction, and/or weight-based adjustment of the mA/kV was utilized to reduce the radiation dose to as low as reasonably achievable. COMPARISON: None available. CLINICAL HISTORY: Abdominal/flank pain, stone suspected. FINDINGS: LOWER CHEST: No  acute abnormality. LIVER: Scattered benign-appearing cysts throughout the liver. GALLBLADDER AND BILE DUCTS: Gallbladder is unremarkable. No biliary ductal dilatation. SPLEEN: No acute abnormality. PANCREAS: No acute abnormality. ADRENAL GLANDS: No acute abnormality. KIDNEYS, URETERS AND BLADDER: 2.2 cm left upper pole renal cyst. 2.2 cm right lower pole renal cyst. No follow-up  recommended. No stones in the kidneys or ureters. No hydronephrosis. No perinephric or periureteral stranding. Urinary bladder is unremarkable. GI AND BOWEL: Stomach demonstrates no acute abnormality. There is no bowel obstruction. Normal appendix. PERITONEUM AND RETROPERITONEUM: No ascites. No free air. VASCULATURE: Aorta is normal in caliber. LYMPH NODES: No lymphadenopathy. REPRODUCTIVE ORGANS: No acute abnormality. BONES AND SOFT TISSUES: No acute osseous abnormality. No focal soft tissue abnormality. IMPRESSION: 1. No acute findings in the abdomen or pelvis. Electronically signed by: Franky Crease MD 09/29/2024 07:32 PM EST RP Workstation: [id number]    Medications Ordered in ED Medications  ketorolac (TORADOL) injection 30 mg (has no administration in time range)   Procedures Procedures  (including critical care time) Medical Decision Making / ED Course   Medical Decision Making Amount and/or Complexity of Data Reviewed Labs: ordered. Decision-making details documented in ED Course. Radiology: ordered and independent interpretation performed. Decision-making details documented in ED Course.  Risk Prescription drug management.    63 y.o. male presents with back pain in lumbar area for 1-2 weeks without signs of radicular pain. No acute traumatic onset. No red flag symptoms of fever, weight loss, saddle anesthesia, weakness, fecal/urinary incontinence or urinary retention.   UA negative for infection CT w/o renal stone or bony lesions. Labs reassuring  Suspect MSK etiology. No indication for imaging emergently. Patient was recommended to take short course of scheduled NSAIDs and engage in early mobility as definitive treatment. Return precautions discussed for worsening or new concerning symptoms.      Final Clinical Impression(s) / ED Diagnoses Final diagnoses:  Strain of lumbar region, initial encounter   The patient appears reasonably screened and/or stabilized for discharge  and I doubt any other medical condition or other [facility] requiring further screening, evaluation, or treatment in the ED at this time. I have discussed the findings, Dx and Tx plan with the patient/family who expressed understanding and agree(s) with the plan. Discharge instructions discussed at length. The patient/family was given strict return precautions who verbalized understanding of the instructions. No further questions at time of discharge.  Disposition: Discharge  Condition: Good  ED Discharge Orders     None        Follow Up: Primary care provider  Call  to schedule an appointment for close follow up    This chart was dictated using voice recognition software.  Despite best efforts to proofread,  errors can occur which can change the documentation meaning.    Trine Raynell Moder, MD 09/29/24 [phone]

## 2024-09-29 NOTE — Discharge Instructions (Addendum)

## 2024-09-29 NOTE — ED Triage Notes (Signed)
 Pt c.o right lower back pain x 1 week. Pt denies urinary s/s, hx of kidney stones but states this feels different. States he feels like he is going to pass out every time he stands up

## 2024-09-29 NOTE — ED Notes (Signed)
 Pt gives verbal consent for mse
